# Patient Record
Sex: Female | Born: 1985
Health system: Southern US, Community
[De-identification: ages and names within clinical notes are randomized; demographics above are authoritative.]

## PROBLEM LIST (undated history)

## (undated) DIAGNOSIS — F988 Other specified behavioral and emotional disorders with onset usually occurring in childhood and adolescence: Secondary | ICD-10-CM

## (undated) DIAGNOSIS — F429 Obsessive-compulsive disorder, unspecified: Secondary | ICD-10-CM

## (undated) DIAGNOSIS — F319 Bipolar disorder, unspecified: Secondary | ICD-10-CM

## (undated) DIAGNOSIS — F4310 Post-traumatic stress disorder, unspecified: Secondary | ICD-10-CM

## (undated) DIAGNOSIS — O200 Threatened abortion: Principal | ICD-10-CM

## (undated) DIAGNOSIS — N912 Amenorrhea, unspecified: Secondary | ICD-10-CM

## (undated) DIAGNOSIS — F419 Anxiety disorder, unspecified: Secondary | ICD-10-CM

## (undated) DIAGNOSIS — M722 Plantar fascial fibromatosis: Secondary | ICD-10-CM

## (undated) DIAGNOSIS — R454 Irritability and anger: Secondary | ICD-10-CM

## (undated) HISTORY — DX: Threatened abortion: O20.0

## (undated) HISTORY — PX: APPENDECTOMY: SHX54

## (undated) HISTORY — DX: Obsessive-compulsive disorder, unspecified: F42.9

## (undated) HISTORY — DX: Other specified behavioral and emotional disorders with onset usually occurring in childhood and adolescence: F98.8

## (undated) HISTORY — DX: Bipolar disorder, unspecified: F31.9

## (undated) HISTORY — DX: Irritability and anger: R45.4

## (undated) HISTORY — DX: Amenorrhea, unspecified: N91.2

## (undated) HISTORY — PX: FINGER SURGERY: SHX640

## (undated) HISTORY — DX: Post-traumatic stress disorder, unspecified: F43.10

## (undated) HISTORY — DX: Plantar fascial fibromatosis: M72.2

---

## 2007-06-07 ENCOUNTER — Ambulatory Visit: Payer: Self-pay | Admitting: Cardiology

## 2008-11-05 ENCOUNTER — Other Ambulatory Visit: Admission: RE | Admit: 2008-11-05 | Discharge: 2008-11-05 | Payer: Self-pay | Admitting: Obstetrics & Gynecology

## 2008-12-11 ENCOUNTER — Emergency Department (HOSPITAL_COMMUNITY): Admission: EM | Admit: 2008-12-11 | Discharge: 2008-12-11 | Payer: Self-pay | Admitting: Emergency Medicine

## 2009-04-20 ENCOUNTER — Inpatient Hospital Stay (HOSPITAL_COMMUNITY): Admission: AD | Admit: 2009-04-20 | Discharge: 2009-04-21 | Payer: Self-pay | Admitting: Obstetrics & Gynecology

## 2009-04-20 ENCOUNTER — Ambulatory Visit: Payer: Self-pay | Admitting: Obstetrics & Gynecology

## 2009-07-31 ENCOUNTER — Emergency Department (HOSPITAL_COMMUNITY): Admission: EM | Admit: 2009-07-31 | Discharge: 2009-08-01 | Payer: Self-pay | Admitting: Emergency Medicine

## 2009-08-05 ENCOUNTER — Other Ambulatory Visit: Admission: RE | Admit: 2009-08-05 | Discharge: 2009-08-05 | Payer: Self-pay | Admitting: Obstetrics and Gynecology

## 2009-10-03 ENCOUNTER — Emergency Department (HOSPITAL_BASED_OUTPATIENT_CLINIC_OR_DEPARTMENT_OTHER): Admission: EM | Admit: 2009-10-03 | Discharge: 2009-10-04 | Payer: Self-pay | Admitting: Emergency Medicine

## 2009-10-04 ENCOUNTER — Ambulatory Visit: Payer: Self-pay | Admitting: Radiology

## 2009-11-14 ENCOUNTER — Emergency Department (HOSPITAL_COMMUNITY): Admission: EM | Admit: 2009-11-14 | Discharge: 2009-11-14 | Payer: Self-pay | Admitting: Emergency Medicine

## 2010-10-04 ENCOUNTER — Emergency Department (HOSPITAL_COMMUNITY)
Admission: EM | Admit: 2010-10-04 | Discharge: 2010-10-04 | Payer: Self-pay | Source: Home / Self Care | Admitting: Emergency Medicine

## 2011-01-05 LAB — DIFFERENTIAL
Basophils Relative: 1 % (ref 0–1)
Eosinophils Absolute: 0.3 10*3/uL (ref 0.0–0.7)
Lymphocytes Relative: 34 % (ref 12–46)
Monocytes Relative: 7 % (ref 3–12)
Neutro Abs: 3.9 10*3/uL (ref 1.7–7.7)

## 2011-01-05 LAB — URINALYSIS, ROUTINE W REFLEX MICROSCOPIC
Glucose, UA: NEGATIVE mg/dL
Hgb urine dipstick: NEGATIVE
Specific Gravity, Urine: 1.025 (ref 1.005–1.030)
Urobilinogen, UA: 0.2 mg/dL (ref 0.0–1.0)
pH: 6 (ref 5.0–8.0)

## 2011-01-05 LAB — HEPATIC FUNCTION PANEL
ALT: 37 U/L — ABNORMAL HIGH (ref 0–35)
Albumin: 3.6 g/dL (ref 3.5–5.2)
Alkaline Phosphatase: 90 U/L (ref 39–117)
Bilirubin, Direct: 0.1 mg/dL (ref 0.0–0.3)

## 2011-01-05 LAB — CBC
HCT: 40 % (ref 36.0–46.0)
Hemoglobin: 13.3 g/dL (ref 12.0–15.0)
MCHC: 33.3 g/dL (ref 30.0–36.0)
Platelets: 233 10*3/uL (ref 150–400)
RBC: 4.75 MIL/uL (ref 3.87–5.11)

## 2011-01-05 LAB — BASIC METABOLIC PANEL
GFR calc Af Amer: >60 mL/min (ref >60)
GFR calc non Af Amer: >60 mL/min (ref >60)
Potassium: 3.8 mEq/L (ref 3.5–5.1)

## 2011-01-10 LAB — URINALYSIS, ROUTINE W REFLEX MICROSCOPIC
Hgb urine dipstick: NEGATIVE
Ketones, ur: NEGATIVE mg/dL
Leukocytes, UA: NEGATIVE
Nitrite: NEGATIVE
Protein, ur: 100 mg/dL — AB
Urobilinogen, UA: 0.2 mg/dL (ref 0.0–1.0)
pH: 6 (ref 5.0–8.0)

## 2011-01-10 LAB — COMPREHENSIVE METABOLIC PANEL
ALT: 28 U/L (ref 0–35)
AST: 19 U/L (ref 0–37)
Alkaline Phosphatase: 129 U/L — ABNORMAL HIGH (ref 39–117)
BUN: 8 mg/dL (ref 6–23)
Chloride: 109 mEq/L (ref 96–112)
GFR calc Af Amer: >60 mL/min (ref >60)
GFR calc non Af Amer: >60 mL/min (ref >60)
Sodium: 139 mEq/L (ref 135–145)
Total Bilirubin: 0.6 mg/dL (ref 0.3–1.2)
Total Protein: 8.3 g/dL (ref 6.0–8.3)

## 2011-01-10 LAB — DIFFERENTIAL
Basophils Absolute: 0.1 10*3/uL (ref 0.0–0.1)
Basophils Relative: 1 % (ref 0–1)
Eosinophils Absolute: 0.2 10*3/uL (ref 0.0–0.7)
Eosinophils Relative: 2 % (ref 0–5)
Lymphocytes Relative: 12 % (ref 12–46)

## 2011-01-10 LAB — URINE MICROSCOPIC-ADD ON

## 2011-01-10 LAB — CBC
MCHC: 32.3 g/dL (ref 30.0–36.0)
MCV: 75.2 fL — ABNORMAL LOW (ref 78.0–100.0)
Platelets: 406 10*3/uL — ABNORMAL HIGH (ref 150–400)
RBC: 6.26 MIL/uL — ABNORMAL HIGH (ref 3.87–5.11)
WBC: 15.2 10*3/uL — ABNORMAL HIGH (ref 4.0–10.5)

## 2011-01-10 LAB — LIPASE, BLOOD: Lipase: 23 U/L (ref 11–59)

## 2011-01-10 LAB — PREGNANCY, URINE: Preg Test, Ur: NEGATIVE

## 2011-02-01 LAB — RH IMMUNE GLOB WKUP(>/=20WKS)(NOT WOMEN'S HOSP): Fetal Screen: NEGATIVE

## 2011-02-01 LAB — CBC
HCT: 33.5 % — ABNORMAL LOW (ref 36.0–46.0)
Hemoglobin: 11.5 g/dL — ABNORMAL LOW (ref 12.0–15.0)
MCHC: 34.4 g/dL (ref 30.0–36.0)
Platelets: 285 10*3/uL (ref 150–400)
RBC: 4.07 MIL/uL (ref 3.87–5.11)
RDW: 14.5 % (ref 11.5–15.5)

## 2011-02-09 LAB — DIFFERENTIAL
Basophils Relative: 1 % (ref 0–1)
Eosinophils Absolute: 0.1 10*3/uL (ref 0.0–0.7)
Eosinophils Relative: 1 % (ref 0–5)
Monocytes Absolute: 0.8 10*3/uL (ref 0.1–1.0)
Monocytes Relative: 6 % (ref 3–12)
Neutrophils Relative %: 84 % — ABNORMAL HIGH (ref 43–77)

## 2011-02-09 LAB — COMPREHENSIVE METABOLIC PANEL
ALT: 15 U/L (ref 0–35)
Alkaline Phosphatase: 83 U/L (ref 39–117)
Glucose, Bld: 101 mg/dL — ABNORMAL HIGH (ref 70–99)
Potassium: 3.2 mEq/L — ABNORMAL LOW (ref 3.5–5.1)
Sodium: 139 mEq/L (ref 135–145)
Total Protein: 6.1 g/dL (ref 6.0–8.3)

## 2011-02-09 LAB — URINALYSIS, ROUTINE W REFLEX MICROSCOPIC
Bilirubin Urine: NEGATIVE
Ketones, ur: NEGATIVE mg/dL
Nitrite: NEGATIVE
Protein, ur: NEGATIVE mg/dL
Urobilinogen, UA: 0.2 mg/dL (ref 0.0–1.0)

## 2011-02-09 LAB — URINE CULTURE

## 2011-02-09 LAB — CBC
Hemoglobin: 12.2 g/dL (ref 12.0–15.0)
RBC: 4.22 MIL/uL (ref 3.87–5.11)
RDW: 14.1 % (ref 11.5–15.5)
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
## 2013-03-31 DIAGNOSIS — J45909 Unspecified asthma, uncomplicated: Secondary | ICD-10-CM | POA: Insufficient documentation

## 2013-06-28 NOTE — H&P (Signed)
HISTORY AND PHYSICAL  Heather Khan is a 27 y.o. female patient with CC: painful teeth  No diagnosis found.  No past medical history on file.  No current facility-administered medications for this encounter.   No current outpatient prescriptions on file.   Allergies not on file Active Problems:   * No active hospital problems. *  Vitals: There were no vitals taken for this visit. Lab results:No results found for this or any previous visit (from the past 24 hour(s)). Radiology Results: No results found. General appearance: alert, cooperative and morbidly obese Head: Normocephalic, without obvious abnormality, atraumatic Eyes: negative Ears: normal TM's and external ear canals both ears Nose: Nares normal. Septum midline. Mucosa normal. No drainage or sinus tenderness. Throat: dental caries and bone loss teeth #'s 3, 4, 5, 8, 9, 10, 11, 12, 14, 18, 19, 29, 30 Neck: no adenopathy, supple, symmetrical, trachea midline and thyroid not enlarged, symmetric, no tenderness/mass/nodules Resp: clear to auscultation bilaterally Cardio: regular rate and rhythm, S1, S2 normal, no murmur, click, rub or gallop  Assessment: 27 YO obese with non-restorableteeth #'s 3, 4, 5, 8, 9, 10, 11, 12, 14, 18, 19, 29, 30.   Plan: extraction teeth #'s 3, 4, 5, 8, 9, 10, 11, 12, 14, 18, 19, 29, 30. Alveoloplasty. General anesthesia. Day surgery.   Craig Ionescu M 06/28/2013

## 2013-06-29 NOTE — Progress Notes (Signed)
Called Dr. Randa Evens answering service [phone] to make him aware that several unsuccessful attempts were made to contact pt for pre-op instructions.

## 2013-07-02 ENCOUNTER — Ambulatory Visit (HOSPITAL_COMMUNITY): Payer: Medicaid Other | Admitting: Anesthesiology

## 2013-07-02 ENCOUNTER — Encounter (HOSPITAL_COMMUNITY): Payer: Self-pay | Admitting: Anesthesiology

## 2013-07-02 ENCOUNTER — Encounter (HOSPITAL_COMMUNITY): Payer: Self-pay | Admitting: *Deleted

## 2013-07-02 ENCOUNTER — Encounter (HOSPITAL_COMMUNITY)
Admission: RE | Disposition: A | Discharge status: Home / Self Care | Payer: Self-pay | Source: Ambulatory Visit | Attending: Oral Surgery

## 2013-07-02 ENCOUNTER — Ambulatory Visit (HOSPITAL_COMMUNITY)
Admission: RE | Admit: 2013-07-02 | Discharge: 2013-07-02 | Disposition: A | Discharge status: Home / Self Care | Payer: Medicaid Other | Source: Ambulatory Visit | Attending: Oral Surgery | Admitting: Oral Surgery

## 2013-07-02 DIAGNOSIS — K029 Dental caries, unspecified: Secondary | ICD-10-CM

## 2013-07-02 DIAGNOSIS — F17200 Nicotine dependence, unspecified, uncomplicated: Secondary | ICD-10-CM | POA: Insufficient documentation

## 2013-07-02 DIAGNOSIS — K056 Periodontal disease, unspecified: Secondary | ICD-10-CM

## 2013-07-02 DIAGNOSIS — Z6839 Body mass index (BMI) 39.0-39.9, adult: Secondary | ICD-10-CM | POA: Insufficient documentation

## 2013-07-02 DIAGNOSIS — F411 Generalized anxiety disorder: Secondary | ICD-10-CM | POA: Insufficient documentation

## 2013-07-02 DIAGNOSIS — M899 Disorder of bone, unspecified: Secondary | ICD-10-CM | POA: Insufficient documentation

## 2013-07-02 HISTORY — PX: MULTIPLE EXTRACTIONS WITH ALVEOLOPLASTY: SHX5342

## 2013-07-02 HISTORY — DX: Anxiety disorder, unspecified: F41.9

## 2013-07-02 LAB — COMPREHENSIVE METABOLIC PANEL
AST: 20 U/L (ref 0–37)
Albumin: 3.6 g/dL (ref 3.5–5.2)
Calcium: 9.3 mg/dL (ref 8.4–10.5)
Creatinine, Ser: 0.66 mg/dL (ref 0.50–1.10)
Total Protein: 7.1 g/dL (ref 6.0–8.3)

## 2013-07-02 LAB — CBC
MCH: 28.6 pg (ref 26.0–34.0)
MCHC: 33.2 g/dL (ref 30.0–36.0)
MCV: 86.2 fL (ref 78.0–100.0)
Platelets: 242 10*3/uL (ref 150–400)
RDW: 14.6 % (ref 11.5–15.5)
WBC: 7.8 10*3/uL (ref 4.0–10.5)

## 2013-07-02 SURGERY — MULTIPLE EXTRACTION WITH ALVEOLOPLASTY
Anesthesia: General | Site: Mouth | Wound class: Clean Contaminated

## 2013-07-02 MED ORDER — OXYCODONE HCL 5 MG/5ML PO SOLN: 5.0000 mg | Freq: Once | ORAL | Status: DC | PRN

## 2013-07-02 MED ORDER — MIDAZOLAM HCL 5 MG/5ML IJ SOLN
INTRAMUSCULAR | Status: DC | PRN
  Administered 2013-07-02: 2 mg via INTRAVENOUS

## 2013-07-02 MED ORDER — OXYCODONE-ACETAMINOPHEN 5-325 MG PO TABS: 1.0000 | ORAL_TABLET | ORAL | Status: DC | PRN

## 2013-07-02 MED ORDER — MEPERIDINE HCL 25 MG/ML IJ SOLN: 6.2500 mg | INTRAMUSCULAR | Status: DC | PRN

## 2013-07-02 MED ORDER — ONDANSETRON HCL 4 MG/2ML IJ SOLN
INTRAMUSCULAR | Status: AC
  Filled 2013-07-02: qty 2

## 2013-07-02 MED ORDER — HYDROMORPHONE HCL PF 1 MG/ML IJ SOLN
0.2500 mg | INTRAMUSCULAR | Status: DC | PRN
  Administered 2013-07-02 (×2): 0.5 mg via INTRAVENOUS

## 2013-07-02 MED ORDER — OXYCODONE HCL 5 MG PO TABS: 5.0000 mg | ORAL_TABLET | Freq: Once | ORAL | Status: DC | PRN

## 2013-07-02 MED ORDER — LACTATED RINGERS IV SOLN
INTRAVENOUS | Status: DC | PRN
  Administered 2013-07-02: 09:00:00 via INTRAVENOUS

## 2013-07-02 MED ORDER — NEOSTIGMINE METHYLSULFATE 1 MG/ML IJ SOLN
INTRAMUSCULAR | Status: DC | PRN
  Administered 2013-07-02: 3 mg via INTRAVENOUS

## 2013-07-02 MED ORDER — LACTATED RINGERS IV SOLN
INTRAVENOUS | Status: DC
  Administered 2013-07-02: 07:00:00 via INTRAVENOUS

## 2013-07-02 MED ORDER — SODIUM CHLORIDE 0.9 % IR SOLN
Status: DC | PRN
  Administered 2013-07-02: 1000 mL

## 2013-07-02 MED ORDER — CLINDAMYCIN PHOSPHATE 600 MG/50ML IV SOLN
600.0000 mg | Freq: Once | INTRAVENOUS | Status: AC
  Administered 2013-07-02: 600 mg via INTRAVENOUS
  Filled 2013-07-02: qty 50

## 2013-07-02 MED ORDER — FENTANYL CITRATE 0.05 MG/ML IJ SOLN
INTRAMUSCULAR | Status: DC | PRN
  Administered 2013-07-02: 150 ug via INTRAVENOUS
  Administered 2013-07-02: 50 ug via INTRAVENOUS

## 2013-07-02 MED ORDER — PROPOFOL 10 MG/ML IV BOLUS
INTRAVENOUS | Status: DC | PRN
  Administered 2013-07-02 (×2): 100 mg via INTRAVENOUS

## 2013-07-02 MED ORDER — GLYCOPYRROLATE 0.2 MG/ML IJ SOLN
INTRAMUSCULAR | Status: DC | PRN
  Administered 2013-07-02: .4 mg via INTRAVENOUS

## 2013-07-02 MED ORDER — LIDOCAINE-EPINEPHRINE 2 %-1:100000 IJ SOLN
INTRAMUSCULAR | Status: DC | PRN
  Administered 2013-07-02: 16 mL

## 2013-07-02 MED ORDER — LIDOCAINE HCL (CARDIAC) 20 MG/ML IV SOLN
INTRAVENOUS | Status: DC | PRN
  Administered 2013-07-02: 100 mg via INTRAVENOUS

## 2013-07-02 MED ORDER — OXYMETAZOLINE HCL 0.05 % NA SOLN
NASAL | Status: DC | PRN
  Administered 2013-07-02: 2 via NASAL

## 2013-07-02 MED ORDER — ROCURONIUM BROMIDE 100 MG/10ML IV SOLN
INTRAVENOUS | Status: DC | PRN
  Administered 2013-07-02: 50 mg via INTRAVENOUS

## 2013-07-02 MED ORDER — ONDANSETRON HCL 4 MG/2ML IJ SOLN
INTRAMUSCULAR | Status: DC | PRN
  Administered 2013-07-02: 4 mg via INTRAVENOUS

## 2013-07-02 MED ORDER — HYDROMORPHONE HCL PF 1 MG/ML IJ SOLN
INTRAMUSCULAR | Status: AC
  Filled 2013-07-02: qty 1

## 2013-07-02 MED ORDER — ONDANSETRON HCL 4 MG/2ML IJ SOLN
4.0000 mg | Freq: Once | INTRAMUSCULAR | Status: AC | PRN
  Administered 2013-07-02: 4 mg via INTRAVENOUS

## 2013-07-02 SURGICAL SUPPLY — 27 items
BUR CROSS CUT FISSURE 1.6 (BURR) ×2 IMPLANT
BUR EGG ELITE 4.0 (BURR) ×2 IMPLANT
CANISTER SUCTION 2500CC (MISCELLANEOUS) ×2 IMPLANT
CLOTH BEACON ORANGE TIMEOUT ST (SAFETY) ×2 IMPLANT
COVER SURGICAL LIGHT HANDLE (MISCELLANEOUS) ×2 IMPLANT
CRADLE DONUT ADULT HEAD (MISCELLANEOUS) ×2 IMPLANT
DECANTER SPIKE VIAL GLASS SM (MISCELLANEOUS) ×2 IMPLANT
GAUZE PACKING FOLDED 2  STR (GAUZE/BANDAGES/DRESSINGS) ×1
GAUZE PACKING FOLDED 2 STR (GAUZE/BANDAGES/DRESSINGS) ×1 IMPLANT
GLOVE BIO SURGEON STRL SZ 6.5 (GLOVE) ×2 IMPLANT
GLOVE BIO SURGEON STRL SZ7.5 (GLOVE) ×2 IMPLANT
GLOVE BIOGEL PI IND STRL 7.0 (GLOVE) ×1 IMPLANT
GLOVE BIOGEL PI INDICATOR 7.0 (GLOVE) ×1
GOWN STRL NON-REIN LRG LVL3 (GOWN DISPOSABLE) ×2 IMPLANT
GOWN STRL REIN XL XLG (GOWN DISPOSABLE) ×2 IMPLANT
KIT BASIN OR (CUSTOM PROCEDURE TRAY) ×2 IMPLANT
KIT ROOM TURNOVER OR (KITS) ×2 IMPLANT
NEEDLE 22X1 1/2 (OR ONLY) (NEEDLE) ×2 IMPLANT
NS IRRIG 1000ML POUR BTL (IV SOLUTION) ×2 IMPLANT
PAD ARMBOARD 7.5X6 YLW CONV (MISCELLANEOUS) ×4 IMPLANT
SUT CHROMIC 3 0 PS 2 (SUTURE) ×4 IMPLANT
SYR CONTROL 10ML LL (SYRINGE) ×2 IMPLANT
TOWEL OR 17X26 10 PK STRL BLUE (TOWEL DISPOSABLE) ×2 IMPLANT
TRAY ENT MC OR (CUSTOM PROCEDURE TRAY) ×2 IMPLANT
TUBING IRRIGATION (MISCELLANEOUS) ×2 IMPLANT
WATER STERILE IRR 1000ML POUR (IV SOLUTION) IMPLANT
YANKAUER SUCT BULB TIP NO VENT (SUCTIONS) ×2 IMPLANT

## 2013-07-02 NOTE — Addendum Note (Signed)
Addendum created 07/02/13 1756 by Coralee Rud, CRNA   Modules edited: Anesthesia Medication Administration

## 2013-07-02 NOTE — Transfer of Care (Signed)
Immediate Anesthesia Transfer of Care Note  Patient: Heather Khan  Procedure(s) Performed: Procedure(s): MULTIPLE EXTRACION 3, 4, 5, 8, 9, 10, 11, 12, 14, 18, 19, 29, 30 WITH ALVEOLOPLASTY (N/A)  Patient Location: PACU  Anesthesia Type:General  Level of Consciousness: awake, sedated and patient cooperative  Airway & Oxygen Therapy: Patient Spontanous Breathing  Post-op Assessment: Report given to PACU RN  Post vital signs: Reviewed and stable  Complications: No apparent anesthesia complications

## 2013-07-02 NOTE — Anesthesia Postprocedure Evaluation (Signed)
Anesthesia Post Note  Patient: Heather Khan  Procedure(s) Performed: Procedure(s) (LRB): MULTIPLE EXTRACION 3, 4, 5, 8, 9, 10, 11, 12, 14, 18, 19, 29, 30 WITH ALVEOLOPLASTY (N/A)  Anesthesia type: general  Patient location: PACU  Post pain: Pain level controlled  Post assessment: Patient's Cardiovascular Status Stable  Last Vitals:  Filed Vitals:   07/02/13 1145  BP: 98/46  Pulse: 68  Temp:   Resp: 16    Post vital signs: Reviewed and stable  Level of consciousness: sedated  Complications: No apparent anesthesia complications

## 2013-07-02 NOTE — Op Note (Signed)
07/02/2013  10:00 AM  PATIENT:  Joselyn Arrow  27 y.o. female  PRE-OPERATIVE DIAGNOSIS:  NON RESTORABLE TEETH 3, 4, 5, 8, 9, 10, 11, 12, 14, 18, 19, 29, 30  POST-OPERATIVE DIAGNOSIS:  SAME  PROCEDURE:  Procedure(s): MULTIPLE EXTRACION 3, 4, 5, 8, 9, 10, 11, 12, 14, 18, 19, 29, 30 WITH ALVEOLOPLASTY  SURGEON:  Surgeon(s): Georgia Lopes, DDS  ANESTHESIA:   local and general  EBL:  minimal  DRAINS: none   SPECIMEN:  No Specimen  COUNTS:  YES  PLAN OF CARE: Discharge to home after PACU  PATIENT DISPOSITION:  PACU - hemodynamically stable.   PROCEDURE DETAILS: Dictation #  Georgia Lopes, DMD 07/02/2013 10:00 AM

## 2013-07-02 NOTE — Anesthesia Procedure Notes (Signed)
Procedure Name: Intubation Date/Time: 07/02/2013 9:17 AM Performed by: Coralee Rud Pre-anesthesia Checklist: Patient identified, Emergency Drugs available, Suction available and Patient being monitored Patient Re-evaluated:Patient Re-evaluated prior to inductionOxygen Delivery Method: Circle system utilized Preoxygenation: Pre-oxygenation with 100% oxygen Intubation Type: IV induction Ventilation: Mask ventilation without difficulty Laryngoscope Size: Miller and 3 Nasal Tubes: Right and Nasal Rae Tube size: 7.0 mm Number of attempts: 1 Placement Confirmation: ETT inserted through vocal cords under direct vision and positive ETCO2

## 2013-07-02 NOTE — Preoperative (Signed)
Beta Blockers   Reason not to administer Beta Blockers:Not Applicable 

## 2013-07-02 NOTE — Anesthesia Preprocedure Evaluation (Signed)
Anesthesia Evaluation  Patient identified by MRN, date of birth, ID band Patient awake    Reviewed: Allergy & Precautions, H&P , NPO status , Patient's Chart, lab work & pertinent test results  Airway Mallampati: I TM Distance: >3 FB Neck ROM: Full    Dental   Pulmonary          Cardiovascular     Neuro/Psych Anxiety    GI/Hepatic   Endo/Other    Renal/GU      Musculoskeletal   Abdominal   Peds  Hematology   Anesthesia Other Findings   Reproductive/Obstetrics                           Anesthesia Physical Anesthesia Plan  ASA: II  Anesthesia Plan: General   Post-op Pain Management:    Induction: Intravenous  Airway Management Planned: Nasal ETT  Additional Equipment:   Intra-op Plan:   Post-operative Plan: Extubation in OR  Informed Consent: I have reviewed the patients History and Physical, chart, labs and discussed the procedure including the risks, benefits and alternatives for the proposed anesthesia with the patient or authorized representative who has indicated his/her understanding and acceptance.     Plan Discussed with: CRNA and Surgeon  Anesthesia Plan Comments:         Anesthesia Quick Evaluation

## 2013-07-02 NOTE — H&P (Signed)
H&P documentation  -History and Physical Reviewed  -Patient has been re-examined  -No change in the plan of care  Heather Khan M  

## 2013-07-03 ENCOUNTER — Encounter (HOSPITAL_COMMUNITY): Payer: Self-pay | Admitting: Oral Surgery

## 2013-07-03 NOTE — Op Note (Signed)
NAMENEOLA, WORRALL               ACCOUNT NO.:  [id number]  MEDICAL RECORD NO.:  [id number]  LOCATION:  [facility]                         FACILITY:  [facility]  PHYSICIAN:  Georgia Lopes, M.D.  DATE OF BIRTH:  11-19-1985  DATE OF PROCEDURE:  07/02/2013 DATE OF DISCHARGE:  07/02/2013                              OPERATIVE REPORT   PREOPERATIVE DIAGNOSIS:  Nonrestorable teeth #3, #4, #5, #8, #9, #10, #11, #12, #14, #18, #19, #29, #30.  POSTOPERATIVE DIAGNOSIS:  Nonrestorable teeth #3, #4, #5, #8, #9, #10, #11, #12, #14, #18, #19, #29, #30.  PROCEDURE:  Extraction of teeth #3, #4, #5, #8, #9, #10, #11, #12, #14, #18, #19, #29, #30.  SURGEON:  Georgia Lopes, M.D.  ANESTHESIA:  General nasal intubation, Dr. Michelle Piper attending.  INDICATIONS FOR PROCEDURE:  Greidy is a 27 year old female who is referred to me by her general dentist for removal of all remaining top teeth and removal of posterior teeth in the mandible.  Past medical history significant for morbid obesity and she is a smoker. Because of the extensiveness of the surgery, need for adequate anesthesia, and airway protection, it was recommended that the patient undergo the procedure at [facility] for intubation under general anesthesia.  PROCEDURE:  The patient was taken to the operating room, placed on the table in supine position.  General anesthesia was administered intravenously and a nasal endotracheal tube was placed and secured.  The eyes were protected.  The patient was draped for the procedure.  Time- out was performed.  The posterior pharynx was suctioned, and then 2% lidocaine 1:100,000 epinephrine was infiltrated in the right and left inferior alveolar block in the mandible and then buccal and palatal infiltration in the maxilla, a total of 16 mL of solution was utilized. A bite-block was placed on the right side of the mouth and a Sweetheart retractor was used to retract the tongue and the left side was operated first.  A  15-blade was used to make an incision around teeth #18 and #19 on the buccal and lingual surface in the mandible and around teeth #8, #9, #10, #11, #12, and #14 in the maxilla.  The periosteum was reflected with a periosteal elevator.  The teeth were elevated with a 301 elevator.  Teeth #8, #9, #10, #11, and #12 were removed uneventfully with the universal upper forceps.  Teeth #14, #18, and #19 required bone removal and sectioning.  The teeth were removed with a 301 elevator. The sockets were then curetted and irrigated and then in the maxilla the periosteum was further reflected to expose the alveolar crest and alveoplasty was performed using the egg-shaped bur and bone file.  Then, the areas were irrigated and closed with 3-0 chromic.  The bite-block was repositioned to the other side of the mouth.  A 15-blade was used to make an incision around teeth #3, #4, and #5 in the maxilla on the palatal and buccal aspects and around teeth #29 and #30 in the mandible. The periosteum was reflected with a periosteal elevator in the maxilla. Bone was removed around teeth #3, #4, and #5, and in the mandible around teeth #29 and 30.  The teeth were  elevated.  Tooth #5 was removed with the universal forceps.  Teeth #3, #4, #29, and #30 required additional bone removal and sectioning.  After the teeth were removed, alveoplasty was performed in the right maxilla using the egg-shaped bur and bone file.  Then, the areas were irrigated and closed with 3-0 chromic.  The oral cavity was then inspected and found to have good contour, hemostasis, and closure.  The oral cavity was irrigated, suctioned, throat pack was removed.  The patient was awakened, taken to the recovery room, breathing spontaneously in good condition.  ESTIMATED BLOOD LOSS:  Minimum.  COMPLICATIONS:  None.  SPECIMENS:  None.     Georgia Lopes, M.D.     SMJ/MEDQ  D:  07/02/2013  T:  07/03/2013  Job:  [id number]

## 2014-02-20 ENCOUNTER — Encounter: Payer: Self-pay | Admitting: *Deleted

## 2014-03-26 ENCOUNTER — Telehealth: Payer: Self-pay | Admitting: Family Medicine

## 2014-03-26 NOTE — Telephone Encounter (Signed)
appt given for June 26 with christy

## 2014-04-19 ENCOUNTER — Encounter: Payer: Self-pay | Admitting: Family

## 2014-04-19 ENCOUNTER — Ambulatory Visit (INDEPENDENT_AMBULATORY_CARE_PROVIDER_SITE_OTHER): Payer: Medicaid Other | Admitting: Family

## 2014-04-19 VITALS — BP 100/68 | HR 90 | Temp 98.8°F | Ht 72.0 in | Wt 309.2 lb

## 2014-04-19 DIAGNOSIS — Z7189 Other specified counseling: Secondary | ICD-10-CM

## 2014-04-19 DIAGNOSIS — Z716 Tobacco abuse counseling: Secondary | ICD-10-CM

## 2014-04-19 DIAGNOSIS — F3289 Other specified depressive episodes: Secondary | ICD-10-CM

## 2014-04-19 DIAGNOSIS — F17200 Nicotine dependence, unspecified, uncomplicated: Secondary | ICD-10-CM

## 2014-04-19 DIAGNOSIS — F329 Major depressive disorder, single episode, unspecified: Secondary | ICD-10-CM

## 2014-04-19 DIAGNOSIS — F32A Depression, unspecified: Secondary | ICD-10-CM

## 2014-04-19 MED ORDER — BUPROPION HCL ER (XL) 150 MG PO TB24: ORAL_TABLET | ORAL | Status: DC

## 2014-04-19 NOTE — Patient Instructions (Signed)
Depression, Adult Depression refers to feeling sad, low, down in the dumps, blue, gloomy, or empty. In general, there are two kinds of depression: 1. Depression that we all experience from time to time because of upsetting life experiences, including the loss of a job or the ending of a relationship (normal sadness or normal grief). This kind of depression is considered normal, is short lived, and resolves within a few days to 2 weeks. (Depression experienced after the loss of a loved one is called bereavement. Bereavement often lasts longer than 2 weeks but normally gets better with time.) 2. Clinical depression, which lasts longer than normal sadness or normal grief or interferes with your ability to function at home, at work, and in school. It also interferes with your personal relationships. It affects almost every aspect of your life. Clinical depression is an illness. Symptoms of depression also can be caused by conditions other than normal sadness and grief or clinical depression. Examples of these conditions are listed as follows:  Physical illness--Some physical illnesses, including underactive thyroid gland (hypothyroidism), severe anemia, specific types of cancer, diabetes, uncontrolled seizures, heart and lung problems, strokes, and chronic pain are commonly associated with symptoms of depression.  Side effects of some prescription medicine--In some people, certain types of prescription medicine can cause symptoms of depression.  Substance abuse--Abuse of alcohol and illicit drugs can cause symptoms of depression. SYMPTOMS Symptoms of normal sadness and normal grief include the following:  Feeling sad or crying for short periods of time.  Not caring about anything (apathy).  Difficulty sleeping or sleeping too much.  No longer able to enjoy the things you used to enjoy.  Desire to be by oneself all the time (social isolation).  Lack of energy or motivation.  Difficulty  concentrating or remembering.  Change in appetite or weight.  Restlessness or agitation. Symptoms of clinical depression include the same symptoms of normal sadness or normal grief and also the following symptoms:  Feeling sad or crying all the time.  Feelings of guilt or worthlessness.  Feelings of hopelessness or helplessness.  Thoughts of suicide or the desire to harm yourself (suicidal ideation).  Loss of touch with reality (psychotic symptoms). Seeing or hearing things that are not real (hallucinations) or having false beliefs about your life or the people around you (delusions and paranoia). DIAGNOSIS  The diagnosis of clinical depression usually is based on the severity and duration of the symptoms. Your caregiver also will ask you questions about your medical history and substance use to find out if physical illness, use of prescription medicine, or substance abuse is causing your depression. Your caregiver also may order blood tests. TREATMENT  Typically, normal sadness and normal grief do not require treatment. However, sometimes antidepressant medicine is prescribed for bereavement to ease the depressive symptoms until they resolve. The treatment for clinical depression depends on the severity of your symptoms but typically includes antidepressant medicine, counseling with a mental health professional, or a combination of both. Your caregiver will help to determine what treatment is best for you. Depression caused by physical illness usually goes away with appropriate medical treatment of the illness. If prescription medicine is causing depression, talk with your caregiver about stopping the medicine, decreasing the dose, or substituting another medicine. Depression caused by abuse of alcohol or illicit drugs abuse goes away with abstinence from these substances. Some adults need professional help in order to stop drinking or using drugs. SEEK IMMEDIATE CARE IF:  You have thoughts  about   hurting yourself or others.  You lose touch with reality (have psychotic symptoms).  You are taking medicine for depression and have a serious side effect. FOR MORE INFORMATION National Alliance on Mental Illness: www.nami.[employer] National Institute of Mental Health: http://www.maynard.net/www.nimh.nih.gov Document Released: 10/08/2000 Document Revised: 04/11/2012 Document Reviewed: 01/10/2012 [facility] Patient Information 2015 [place]ExitCare, [place]LL[place]. This information is not intended to replace advice given to you by your health care Heather Khan. Make sure you discuss any questions you have with your health care Heather Khan. Smoking Cessation Quitting smoking is important to your health and has many advantages. However, it is not always easy to quit since nicotine is a very addictive drug. Often times, people try 3 times or more before being able to quit. This document explains the best ways for you to prepare to quit smoking. Quitting takes hard work and a lot of effort, but you can do it. ADVANTAGES OF QUITTING SMOKING  You will live longer, feel better, and live better.  Your body will feel the impact of quitting smoking almost immediately.  Within 20 minutes, blood pressure decreases. Your pulse returns to its normal level.  After 8 hours, carbon monoxide levels in the blood return to normal. Your oxygen level increases.  After 24 hours, the chance of having a heart attack starts to decrease. Your breath, hair, and body stop smelling like smoke.  After 48 hours, damaged nerve endings begin to recover. Your sense of taste and smell improve.  After 72 hours, the body is virtually free of nicotine. Your bronchial tubes relax and breathing becomes easier.  After 2 to 12 weeks, lungs can hold more air. Exercise becomes easier and circulation improves.  The risk of having a heart attack, stroke, cancer, or lung disease is greatly reduced.  After 1 year, the risk of coronary heart disease is cut in half.  After 5  years, the risk of stroke falls to the same as a nonsmoker.  After 10 years, the risk of lung cancer is cut in half and the risk of other cancers decreases significantly.  After 15 years, the risk of coronary heart disease drops, usually to the level of a nonsmoker.  If you are pregnant, quitting smoking will improve your chances of having a healthy baby.  The people you live with, especially any children, will be healthier.  You will have extra money to spend on things other than cigarettes. QUESTIONS TO THINK ABOUT BEFORE ATTEMPTING TO QUIT You may want to talk about your answers with your caregiver.  Why do you want to quit?  If you tried to quit in the past, what helped and what did not?  What will be the most difficult situations for you after you quit? How will you plan to handle them?  Who can help you through the tough times? Your family? Friends? A caregiver?  What pleasures do you get from smoking? What ways can you still get pleasure if you quit? Here are some questions to ask your caregiver:  How can you help me to be successful at quitting?  What medicine do you think would be best for me and how should I take it?  What should I do if I need more help?  What is smoking withdrawal like? How can I get information on withdrawal? GET READY  Set a quit date.  Change your environment by getting rid of all cigarettes, ashtrays, matches, and lighters in your home, car, or work. Do not let people smoke in your home.  Review your past attempts to quit. Think about what worked and what did not. GET SUPPORT AND ENCOURAGEMENT You have a better chance of being successful if you have help. You can get support in many ways.  Tell your family, friends, and co-workers that you are going to quit and need their support. Ask them not to smoke around you.  Get individual, group, or telephone counseling and support. Programs are available at [employer] and health centers. Call  your local health department for information about programs in your area.  Spiritual beliefs and practices may help some smokers quit.  Download a "quit meter" on your computer to keep track of quit statistics, such as how long you have gone without smoking, cigarettes not smoked, and money saved.  Get a self-help book about quitting smoking and staying off of tobacco. LEARN NEW SKILLS AND BEHAVIORS  Distract yourself from urges to smoke. Talk to someone, go for a walk, or occupy your time with a task.  Change your normal routine. Take a different route to work. Drink tea instead of coffee. Eat breakfast in a different place.  Reduce your stress. Take a hot bath, exercise, or read a book.  Plan something enjoyable to do every day. Reward yourself for not smoking.  Explore interactive web-based programs that specialize in helping you quit. GET MEDICINE AND USE IT CORRECTLY Medicines can help you stop smoking and decrease the urge to smoke. Combining medicine with the above behavioral methods and support can greatly increase your chances of successfully quitting smoking.  Nicotine replacement therapy helps deliver nicotine to your body without the negative effects and risks of smoking. Nicotine replacement therapy includes nicotine gum, lozenges, inhalers, nasal sprays, and skin patches. Some may be available over-the-counter and others require a prescription.  Antidepressant medicine helps people abstain from smoking, but how this works is unknown. This medicine is available by prescription.  Nicotinic receptor partial agonist medicine simulates the effect of nicotine in your brain. This medicine is available by prescription. Ask your caregiver for advice about which medicines to use and how to use them based on your health history. Your caregiver will tell you what side effects to look out for if you choose to be on a medicine or therapy. Carefully read the information on the package. Do  not use any other product containing nicotine while using a nicotine replacement product.  RELAPSE OR DIFFICULT SITUATIONS Most relapses occur within the first 3 months after quitting. Do not be discouraged if you start smoking again. Remember, most people try several times before finally quitting. You may have symptoms of withdrawal because your body is used to nicotine. You may crave cigarettes, be irritable, feel very hungry, cough often, get headaches, or have difficulty concentrating. The withdrawal symptoms are only temporary. They are strongest when you first quit, but they will go away within 10-14 days. To reduce the chances of relapse, try to:  Avoid drinking alcohol. Drinking lowers your chances of successfully quitting.  Reduce the amount of caffeine you consume. Once you quit smoking, the amount of caffeine in your body increases and can give you symptoms, such as a rapid heartbeat, sweating, and anxiety.  Avoid smokers because they can make you want to smoke.  Do not let weight gain distract you. Many smokers will gain weight when they quit, usually less than 10 pounds. Eat a healthy diet and stay active. You can always lose the weight gained after you quit.  Find ways to improve  your mood other than smoking. FOR MORE INFORMATION  www.smokefree.gov  Document Released: 10/05/2001 Document Revised: 04/11/2012 Document Reviewed: 01/20/2012 [facility] Patient Information 2015 [place]ExitCare, [place]LLC. This information is not intended to replace advice given to you by your health care Heather Khan. Make sure you discuss any questions you have with your health care Heather Khan.

## 2014-04-19 NOTE — Progress Notes (Signed)
   Subjective:    Patient ID: Heather Khan, female    DOB: 03/13/1986, 28 y.o.   MRN: [id number]  HPI Pt presents to office to establish care and discuss depression. Pt states she has been feeling depression since January. Pt denies trying anything for it, but feels like her being overweight contributes to her depression. Pt would also like to discuss smoking cessation today. She states she smokes about a pack and half a day for the last 5 years.  Pt denies any pain, SOB, or suicidal thoughts.    Review of Systems  HENT: Negative.   Respiratory: Negative.   Cardiovascular: Negative.   Gastrointestinal: Negative.   Genitourinary: Negative.   Musculoskeletal: Negative.   Neurological: Negative.   Hematological: Negative.   All other systems reviewed and are negative.      Objective:   Physical Exam  Vitals reviewed. Constitutional: She is oriented to person, place, and time. She appears well-developed and well-nourished. No distress.  HENT:  Head: Normocephalic and atraumatic.  Right Ear: External ear normal.  Left Ear: External ear normal.  Nose: Nose normal.  Mouth/Throat: Oropharynx is clear and moist.  Eyes: Pupils are equal, round, and reactive to light.  Neck: Normal range of motion. Neck supple. No thyromegaly present.  Cardiovascular: Normal rate, regular rhythm, normal heart sounds and intact distal pulses.   No murmur heard. Pulmonary/Chest: Effort normal and breath sounds normal. No respiratory distress. She has no wheezes.  Abdominal: Soft. Bowel sounds are normal. She exhibits no distension. There is no tenderness.  Musculoskeletal: Normal range of motion. She exhibits no edema and no tenderness.  Neurological: She is alert and oriented to person, place, and time. She has normal reflexes. No cranial nerve deficit.  Skin: Skin is warm and dry.  Psychiatric: She has a normal mood and affect. Her behavior is normal. Judgment and thought content normal.    BP  100/68  Pulse 90  Temp(Src) 98.8 F (37.1 C) (Oral)  Ht 6' (1.829 m)  Wt 309 lb 3.2 oz (140.252 kg)  BMI 41.93 kg/m2       Assessment & Plan:  1. Depression -Stress management -Weight loss discuss -Encouraged healthy eating and exercise - buPROPion (WELLBUTRIN XL) 150 MG 24 hr tablet; 150 mg daily for 3 days, then 150mg  BID for 8 weeks. Start thearpy 1-2 weeks before quite date  Dispense: 60 tablet; Refill: 6  2. Encounter for smoking cessation counseling - buPROPion (WELLBUTRIN XL) 150 MG 24 hr tablet; 150 mg daily for 3 days, then 150mg  BID for 8 weeks. Start thearpy 1-2 weeks before quite date  Dispense: 60 tablet; Refill: 6  Jannifer Rodneyhristy Hawks, FNP

## 2014-05-17 ENCOUNTER — Ambulatory Visit: Payer: Medicaid Other | Admitting: Family

## 2014-05-22 ENCOUNTER — Telehealth: Payer: Self-pay | Admitting: Family

## 2014-05-22 NOTE — Telephone Encounter (Signed)
Appt made

## 2014-05-23 ENCOUNTER — Ambulatory Visit (INDEPENDENT_AMBULATORY_CARE_PROVIDER_SITE_OTHER): Payer: Medicaid Other | Admitting: Family Medicine

## 2014-05-23 ENCOUNTER — Encounter: Payer: Self-pay | Admitting: Family Medicine

## 2014-05-23 VITALS — BP 112/73 | HR 75 | Temp 98.4°F | Ht 72.0 in | Wt 309.4 lb

## 2014-05-23 DIAGNOSIS — M722 Plantar fascial fibromatosis: Secondary | ICD-10-CM

## 2014-05-23 MED ORDER — NAPROXEN 500 MG PO TABS: 500.0000 mg | ORAL_TABLET | Freq: Two times a day (BID) | ORAL | Status: DC

## 2014-05-23 NOTE — Progress Notes (Signed)
   Subjective:    Patient ID: Heather Khan, female    DOB: Jul 12, 1986, 28 y.o.   MRN: [id number]  HPI C/o bilateral foot pain and plantar fascitis.  She has been having worsening pain.  She was seeing ortho and she had heel injections which she states made it worse.  She could not afford the Inserts and she stopped going.  She has hx of obesity and she states she has been dieting and cannot lose weight.  She would like to lose weight and see if this will help her foot problems.   Review of Systems    No chest pain, SOB, HA, dizziness, vision change, N/V, diarrhea, constipation, dysuria, urinary urgency or frequency, myalgias, arthralgias or rash.  Objective:   Physical Exam  Vital signs noted  Well developed well nourished obese female.  HEENT - Head atraumatic Normocephalic                Eyes - PERRLA, Conjuctiva - clear Sclera- Clear EOMI                Ears - EAC's Wnl TM's Wnl Gross Hearing WNL                Nose - Nares patent                 Throat - oropharanx wnl Respiratory - Lungs CTA bilateral Cardiac - RRR S1 and S2 without murmur GI - Abdomen soft Nontender and bowel sounds active x 4 Extremities - No edema. Neuro - Grossly intact. MS - TTP bilateral heels     Assessment & Plan:  Plantar fasciitis, bilateral - Plan: naproxen (NAPROSYN) 500 MG tablet Po bid x 2 weeks and continue rolling over frozen water bottles and stretches and splints. Use otc inserts for feet.  Losing weight will help.  Recommend diet and exercise counseling for  Obesity and follow up prn.  Deatra CanterWilliam J Oxford FNP

## 2014-06-10 ENCOUNTER — Ambulatory Visit (INDEPENDENT_AMBULATORY_CARE_PROVIDER_SITE_OTHER): Payer: Medicaid Other | Admitting: Pharmacist

## 2014-06-10 ENCOUNTER — Encounter: Payer: Self-pay | Admitting: Pharmacist

## 2014-06-10 DIAGNOSIS — R635 Abnormal weight gain: Secondary | ICD-10-CM

## 2014-06-10 DIAGNOSIS — N926 Irregular menstruation, unspecified: Secondary | ICD-10-CM | POA: Insufficient documentation

## 2014-06-10 MED ORDER — PHENTERMINE HCL 37.5 MG PO TABS
ORAL_TABLET | ORAL | Status: DC
Start: 2014-06-10 — End: 2014-07-15

## 2014-06-10 MED ORDER — PHENTERMINE HCL 37.5 MG PO TABS: ORAL_TABLET | ORAL | Status: DC

## 2014-06-10 NOTE — Progress Notes (Signed)
Subjective:     Heather Khan is a 28 y.o. female here for discussion regarding weight loss. She has noted a weight gain of approximately 40 pounds over the last 10 years. She feels ideal weight is 180 pounds. Weight at graduation from high school was 275 pounds. History of eating disorders: none. There is a family history positive for obesity in the patient and father has diebetes. Previous treatments for obesity include OTC appetite suppressants: green tea extract, self-directed dieting and self directed exercise. Obesity associated medical conditions: amenorrhea and difficulty conceiving. Obesity associated medications: none. Cardiovascular risk factors besides obesity: obesity (BMI >= 30 kg/m2), sedentary lifestyle and smoking/ tobacco exposure.  The following portions of the patient's history were reviewed and updated as appropriate: allergies, current medications, past family history, past medical history, past social history, past surgical history and problem list.  Objective:    Body mass index is 41.76 kg/(m^2).  Filed Vitals:   06/10/14 1425  BP: 102/74  Pulse: 80    Assessment:    Obesity. I assessed Heather Khan to be in an action stage with respect to weight loss.    Plan:    General weight loss/lifestyle modification strategies discussed (elicit support from others; identify saboteurs; non-food rewards, etc). Behavioral treatment: stress management. Diet interventions: moderate (500 kCal/d) deficit diet. Regular aerobic exercise program discussed. Medication: phentermine. 37.5mg  1/2 to 1 tablet qam Follow up in 4 weeks.  Henrene Pastorammy Telesia Ates, PharmD, CPP

## 2014-07-08 ENCOUNTER — Ambulatory Visit: Payer: Self-pay

## 2014-07-15 ENCOUNTER — Encounter: Payer: Self-pay | Admitting: Pharmacist

## 2014-07-15 ENCOUNTER — Ambulatory Visit (INDEPENDENT_AMBULATORY_CARE_PROVIDER_SITE_OTHER): Payer: Medicaid Other | Admitting: Pharmacist

## 2014-07-15 ENCOUNTER — Ambulatory Visit: Payer: Medicaid Other | Admitting: Family Medicine

## 2014-07-15 DIAGNOSIS — R635 Abnormal weight gain: Secondary | ICD-10-CM

## 2014-07-15 MED ORDER — PHENTERMINE HCL 37.5 MG PO TABS: ORAL_TABLET | ORAL | Status: DC

## 2014-07-15 NOTE — Addendum Note (Signed)
Addended by: Henrene Pastor on: 07/15/2014 03:12 PM   Modules accepted: Level of Service

## 2014-07-15 NOTE — Progress Notes (Signed)
Subjective:     Heather Khan is a 28 y.o. female here for discussion regarding weight loss. She has noted a weight gain of approximately 40 pounds over the last 10 years. She feels ideal weight is 180 pounds. Weight at graduation from high school was 275 pounds. History of eating disorders: none. There is a family history positive for obesity in the patient and father has diabetes. Previous treatments for obesity include OTC appetite suppressants: green tea extract, self-directed dieting and self directed exercise. Obesity associated medical conditions: amenorrhea and difficulty conceiving. Obesity associated medications: none. Cardiovascular risk factors besides obesity: obesity (BMI >= 30 kg/m2), sedentary lifestyle and smoking/ tobacco exposure.  Started phenterine 37.5mg  1/2 to 1 tablet qam 1 month ago.  Patient has changed diet - no fried foods, limiting sweets, no sodas / water only.  Increase in fresh vegetables.   Patient also has nasal congestion, coughing and facial pain / tenderness for last 3 days.   The following portions of the patient's history were reviewed and updated as appropriate: allergies, current medications, past family history, past medical history, past social history, past surgical history and problem list.  Objective:    Body mass index is 40.81 kg/(m^2).  Filed Vitals:   07/15/14 1218  BP: 108/78  Pulse: 72    Assessment:    Obesity - patient has lost 7# since last month when phentermine started.  Medication Management - patient newly prescribed fluoxitine and trazadone from psychiatrist Suspect sinus infection -    Plan:    General weight loss/lifestyle modification strategies discussed (elicit support from others; identify saboteurs; non-food rewards, etc). Behavioral treatment: stress management. Diet interventions: Continue moderate (500 kCal/d) deficit diet. Regular aerobic exercise program discussed. Medication: phentermine. 37.5mg  1/2 to 1  tablet qam.  I reviewed with patient s/s of serotonin syndrome since on more than 1 medication that can increase serotonin.  Also discussed increase in fertility with weight loss and that phentermine is contraindicated with pregnancy.  Patient to discontinue phentermine is suspected pregnancy.  Follow up in 4 weeks.  ** patient triaged for suspected sinus infection - she is to RTC at 2:45pm for appt to assess.   Henrene Pastor, PharmD, CPP

## 2014-07-16 ENCOUNTER — Ambulatory Visit: Payer: Medicaid Other | Admitting: Family Medicine

## 2014-08-19 ENCOUNTER — Ambulatory Visit: Payer: Self-pay

## 2014-09-16 ENCOUNTER — Telehealth: Payer: Self-pay | Admitting: Family Medicine

## 2014-09-16 NOTE — Telephone Encounter (Signed)
Appointment given for tomorrow with Heather Khan. Patient advised if she got worse to go to urgent care today.

## 2014-09-17 ENCOUNTER — Telehealth: Payer: Self-pay | Admitting: Nurse Practitioner

## 2014-09-17 ENCOUNTER — Ambulatory Visit (INDEPENDENT_AMBULATORY_CARE_PROVIDER_SITE_OTHER): Payer: Medicaid Other | Admitting: Nurse Practitioner

## 2014-09-17 ENCOUNTER — Encounter: Payer: Self-pay | Admitting: Nurse Practitioner

## 2014-09-17 VITALS — BP 110/73 | HR 89 | Temp 98.0°F | Ht 72.0 in | Wt 294.0 lb

## 2014-09-17 DIAGNOSIS — J069 Acute upper respiratory infection, unspecified: Secondary | ICD-10-CM

## 2014-09-17 MED ORDER — DOXYCYCLINE HYCLATE 100 MG PO TABS: 100.0000 mg | ORAL_TABLET | Freq: Two times a day (BID) | ORAL | Status: DC

## 2014-09-17 NOTE — Progress Notes (Signed)
   Subjective:    Patient ID: Heather Khan, female    DOB: Mar 10, 1986, 28 y.o.   MRN: [id number]  HPI Patient in today with c/o cough and congestion that started-1 week ago- no OTC meds    Review of Systems  Constitutional: Negative.  Negative for fever, chills and appetite change.  HENT: Positive for congestion, postnasal drip, sinus pressure and sore throat. Negative for trouble swallowing and voice change.   Respiratory: Positive for cough.   Cardiovascular: Negative.   Genitourinary: Negative.   Neurological: Negative.   Psychiatric/Behavioral: Negative.   All other systems reviewed and are negative.      Objective:   Physical Exam  Constitutional: She is oriented to person, place, and time. She appears well-developed and well-nourished.  HENT:  Right Ear: Hearing, tympanic membrane, external ear and ear canal normal.  Left Ear: Hearing, tympanic membrane, external ear and ear canal normal.  Nose: Mucosal edema and rhinorrhea present. Right sinus exhibits no maxillary sinus tenderness and no frontal sinus tenderness. Left sinus exhibits no maxillary sinus tenderness and no frontal sinus tenderness.  Mouth/Throat: Uvula is midline, oropharynx is clear and moist and mucous membranes are normal.  Neck: Normal range of motion. Neck supple.  Cardiovascular: Normal rate, regular rhythm and normal heart sounds.   Pulmonary/Chest: Effort normal and breath sounds normal.  Deep wet cough  Abdominal: Soft. Bowel sounds are normal.  Lymphadenopathy:    She has no cervical adenopathy.  Neurological: She is alert and oriented to person, place, and time.  Skin: Skin is warm and dry.  Psychiatric: She has a normal mood and affect. Her behavior is normal. Judgment and thought content normal.   BP 110/73 mmHg  Pulse 89  Temp(Src) 98 F (36.7 C) (Oral)  Ht 6' (1.829 m)  Wt 294 lb (133.358 kg)  BMI 39.86 kg/m2        Assessment & Plan:   1. Upper respiratory infection with  cough and congestion    Meds ordered this encounter  Medications  . doxycycline (VIBRA-TABS) 100 MG tablet    Sig: Take 1 tablet (100 mg total) by mouth 2 (two) times daily.    Dispense:  20 tablet    Refill:  0    Order Specific Question:  Supervising Provider    Answer:  Ernestina PennaMOORE, DONALD W [[id number]]   1. Take meds as prescribed 2. Use a cool mist humidifier especially during the winter months and when heat has been humid. 3. Use saline nose sprays frequently 4. Saline irrigations of the nose can be very helpful if done frequently.  * 4X daily for 1 week*  * Use of a nettie pot can be helpful with this. Follow directions with this* 5. Drink plenty of fluids 6. Keep thermostat turn down low 7.For any cough or congestion  Use plain Mucinex- regular strength or max strength is fine   * Children- consult with Pharmacist for dosing 8. For fever or aces or pains- take tylenol or ibuprofen appropriate for age and weight.  * for fevers greater than 101 orally you may alternate ibuprofen and tylenol every  3 hours.   Mary-Margaret Daphine DeutscherMartin, FNP

## 2014-09-17 NOTE — Patient Instructions (Signed)

## 2014-09-18 NOTE — Telephone Encounter (Signed)
Ok to give her a note? Please advise.

## 2014-09-20 NOTE — Telephone Encounter (Signed)
Ok for note 

## 2014-09-23 ENCOUNTER — Encounter: Payer: Self-pay | Admitting: *Deleted

## 2014-09-23 NOTE — Telephone Encounter (Signed)
Left message on mother's phone that work excuse for Marchelle Folksmanda is ready.

## 2014-10-10 ENCOUNTER — Ambulatory Visit: Payer: Medicaid Other | Admitting: Nurse Practitioner

## 2014-10-23 ENCOUNTER — Ambulatory Visit: Payer: Medicaid Other | Admitting: Family Medicine

## 2014-11-01 ENCOUNTER — Encounter (INDEPENDENT_AMBULATORY_CARE_PROVIDER_SITE_OTHER): Payer: Medicaid Other | Admitting: Family Medicine

## 2014-11-01 ENCOUNTER — Encounter (INDEPENDENT_AMBULATORY_CARE_PROVIDER_SITE_OTHER): Payer: Self-pay

## 2014-11-01 DIAGNOSIS — F4312 Post-traumatic stress disorder, chronic: Secondary | ICD-10-CM

## 2014-11-01 DIAGNOSIS — F3181 Bipolar II disorder: Secondary | ICD-10-CM

## 2014-11-01 NOTE — Progress Notes (Signed)
This encounter was created in error - please disregard.

## 2014-11-02 LAB — CMP14+EGFR
ALT: 27 IU/L (ref 0–32)
AST: 12 IU/L (ref 0–40)
Albumin/Globulin Ratio: 1.6 (ref 1.1–2.5)
Albumin: 4.3 g/dL (ref 3.5–5.5)
Alkaline Phosphatase: 102 IU/L (ref 39–117)
BUN/Creatinine Ratio: 14 (ref 8–20)
BUN: 10 mg/dL (ref 6–20)
CO2: 23 mmol/L (ref 18–29)
Calcium: 9.6 mg/dL (ref 8.7–10.2)
Chloride: 99 mmol/L (ref 97–108)
Creatinine, Ser: 0.7 mg/dL (ref 0.57–1.00)
GFR calc Af Amer: 138 mL/min/{1.73_m2} (ref >59)
GFR calc non Af Amer: 120 mL/min/{1.73_m2} (ref >59)
Globulin, Total: 2.7 g/dL (ref 1.5–4.5)
Glucose: 94 mg/dL (ref 65–99)
Potassium: 4.3 mmol/L (ref 3.5–5.2)
Sodium: 141 mmol/L (ref 134–144)
Total Bilirubin: 0.3 mg/dL (ref 0.0–1.2)
Total Protein: 7 g/dL (ref 6.0–8.5)

## 2014-11-02 LAB — CBC WITH DIFFERENTIAL
Basophils Absolute: 0 10*3/uL (ref 0.0–0.2)
Basos: 0 %
Eos: 2 %
Eosinophils Absolute: 0.2 10*3/uL (ref 0.0–0.4)
HCT: 41 % (ref 34.0–46.6)
Hemoglobin: 13.6 g/dL (ref 11.1–15.9)
Immature Grans (Abs): 0 10*3/uL (ref 0.0–0.1)
Immature Granulocytes: 0 %
Lymphocytes Absolute: 2.8 10*3/uL (ref 0.7–3.1)
Lymphs: 29 %
MCH: 28.3 pg (ref 26.6–33.0)
MCHC: 33.2 g/dL (ref 31.5–35.7)
MCV: 85 fL (ref 79–97)
Monocytes Absolute: 0.6 10*3/uL (ref 0.1–0.9)
Monocytes: 6 %
Neutrophils Absolute: 6.1 10*3/uL (ref 1.4–7.0)
Neutrophils Relative %: 63 %
Platelets: 313 10*3/uL (ref 150–379)
RBC: 4.81 x10E6/uL (ref 3.77–5.28)
RDW: 14.3 % (ref 12.3–15.4)
WBC: 9.7 10*3/uL (ref 3.4–10.8)

## 2014-11-02 LAB — TSH: TSH: 1.83 u[IU]/mL (ref 0.450–4.500)

## 2014-11-02 LAB — PREGNANCY, URINE: Preg Test, Ur: NEGATIVE

## 2014-11-02 LAB — LIPID PANEL
Chol/HDL Ratio: 4.2 ratio units (ref 0.0–4.4)
Cholesterol, Total: 135 mg/dL (ref 100–199)
HDL: 32 mg/dL — ABNORMAL LOW (ref >39)
LDL Calculated: 75 mg/dL (ref 0–99)
Triglycerides: 142 mg/dL (ref 0–149)
VLDL Cholesterol Cal: 28 mg/dL (ref 5–40)

## 2015-01-14 ENCOUNTER — Encounter: Payer: Self-pay | Admitting: Family Medicine

## 2015-01-14 ENCOUNTER — Ambulatory Visit (INDEPENDENT_AMBULATORY_CARE_PROVIDER_SITE_OTHER): Payer: Medicaid Other | Admitting: Family Medicine

## 2015-01-14 ENCOUNTER — Other Ambulatory Visit: Payer: Self-pay | Admitting: *Deleted

## 2015-01-14 VITALS — BP 118/82 | HR 103 | Temp 97.2°F | Ht 72.0 in | Wt 303.8 lb

## 2015-01-14 DIAGNOSIS — M722 Plantar fascial fibromatosis: Secondary | ICD-10-CM

## 2015-01-14 DIAGNOSIS — Z3490 Encounter for supervision of normal pregnancy, unspecified, unspecified trimester: Secondary | ICD-10-CM

## 2015-01-14 DIAGNOSIS — N912 Amenorrhea, unspecified: Secondary | ICD-10-CM | POA: Diagnosis not present

## 2015-01-14 LAB — POCT URINE PREGNANCY: Preg Test, Ur: POSITIVE

## 2015-01-14 NOTE — Progress Notes (Signed)
Subjective:  Patient ID: Heather Khan, female    DOB: 05-25-1986  Age: 29 y.o. MRN: [id number]  CC: Plantar Fasciitis New to me.  HPI Heather Khan presents for pain in the feet bilaterally. Left greater than right. She had injections at a podiatrist that made it so much work she couldn't walk for 3 days. Onset 2-3 years ago waxing and waning much increased over the last few weeks. Pain is moderately severe.  History Heather Khan has a past medical history of Anxiety.   She has past surgical history that includes Appendectomy; Finger surgery; and Multiple extractions with alveoloplasty (N/A, 07/02/2013).   Her family history includes Diabetes in her father and maternal grandfather; Healthy in her sister, sister, and son; Hypertension in her mother; Kidney disease in her maternal grandfather.She reports that she has been smoking Cigarettes.  She has a 10 pack-year smoking history. She has never used smokeless tobacco. She reports that she drinks alcohol. She reports that she does not use illicit drugs.  No current outpatient prescriptions on file prior to visit.   No current facility-administered medications on file prior to visit.    ROS Review of Systems  Constitutional: Negative for fever, chills, diaphoresis, appetite change, fatigue and unexpected weight change.  HENT: Negative for congestion, ear pain, hearing loss, postnasal drip, rhinorrhea, sneezing, sore throat and trouble swallowing.   Eyes: Negative for pain.  Respiratory: Negative for cough, chest tightness and shortness of breath.   Cardiovascular: Negative for chest pain and palpitations.  Gastrointestinal: Negative for nausea, vomiting, abdominal pain, diarrhea and constipation.  Genitourinary: Negative for dysuria, frequency and menstrual problem.  Musculoskeletal: Negative for joint swelling and arthralgias.  Skin: Negative for rash.  Neurological: Negative for dizziness, weakness, numbness and headaches.    Psychiatric/Behavioral: Negative for dysphoric mood and agitation.    Objective:  BP 118/82 mmHg  Pulse 103  Temp(Src) 97.2 F (36.2 C) (Oral)  Ht 6' (1.829 m)  Wt 303 lb 12.8 oz (137.803 kg)  BMI 41.19 kg/m2  LMP 11/10/2014  BP Readings from Last 3 Encounters:  01/14/15 118/82  09/17/14 110/73  07/15/14 108/78    Wt Readings from Last 3 Encounters:  01/14/15 303 lb 12.8 oz (137.803 kg)  09/17/14 294 lb (133.358 kg)  07/15/14 301 lb (136.533 kg)     Physical Exam  Constitutional: She is oriented to person, place, and time. She appears well-developed and well-nourished. No distress.  HENT:  Head: Normocephalic and atraumatic.  Right Ear: External ear normal.  Left Ear: External ear normal.  Nose: Nose normal.  Mouth/Throat: Oropharynx is clear and moist.  Eyes: Conjunctivae and EOM are normal. Pupils are equal, round, and reactive to light.  Neck: Normal range of motion. Neck supple. No thyromegaly present.  Cardiovascular: Normal rate, regular rhythm and normal heart sounds.   No murmur heard. Pulmonary/Chest: Effort normal and breath sounds normal. No respiratory distress. She has no wheezes. She has no rales.  Abdominal: Soft. Bowel sounds are normal.  Musculoskeletal: She exhibits tenderness (at the plantar fascial at the plantar heel left greater than right.).  Lymphadenopathy:    She has no cervical adenopathy.  Neurological: She is alert and oriented to person, place, and time. She has normal reflexes.  Skin: Skin is warm and dry.  Psychiatric: She has a normal mood and affect. Her behavior is normal. Judgment and thought content normal.    No results found for: HGBA1C  Lab Results  Component Value Date   WBC  9.7 11/01/2014   HGB 13.6 11/01/2014   HCT 41.0 11/01/2014   PLT 313 11/01/2014   GLUCOSE 94 11/01/2014   CHOL 135 11/01/2014   TRIG 142 11/01/2014   HDL 32* 11/01/2014   LDLCALC 75 11/01/2014   ALT 27 11/01/2014   AST 12 11/01/2014   NA 141  11/01/2014   K 4.3 11/01/2014   CL 99 11/01/2014   CREATININE 0.70 11/01/2014   BUN 10 11/01/2014   CO2 23 11/01/2014   TSH 1.830 11/01/2014    No results found.  Assessment & Plan:   Heather Khan was seen today for plantar fasciitis.  Diagnoses and all orders for this visit:  Plantar fasciitis, bilateral  Amenorrhea Orders: -     POCT urine pregnancy   I have discontinued [name]'s naproxen and doxycycline.  No orders of the defined types were placed in this encounter.   Posterior splinting for plantar fascia Arch support reviewed  Patient information regarding carbohydrate and calorie counting handout given. Follow-up: No Follow-up on file.  Mechele Claude, M.D.

## 2015-01-14 NOTE — Patient Instructions (Addendum)
Wear shoes with excellent arch support. Consider adding an extra arch support on top of that such as a Dr. Margart SicklesScholl's variety. Do not wear flip-flops except for short periods in the shower or something similar.  You will need to start wearing a posterior splint to hold her foot in proper position to heal the plantar fasciitis. You should wear these from the time you first lay down at night until following morning then they can be removed as long she wear good arch support from that time forward.  Weight loss is critical to management of any type of foot pain but especially plantar fascitis. Because of that I believe you could go back on the Adipex and follow a very strict low-carb diet to lose as much weight as possible. Every pound is beneficial to healing plantar fasciitis. However, medications are not generally safe for pregnancy. therefore until you are sure that you're using adequate birth control that you should not take this medicine.  Basic Carbohydrate Counting  Carbohydrate counting is a method for keeping track of the amount of carbohydrates you eat. Eating carbohydrates naturally increases the level of sugar (glucose) in your blood, so it is important for you to know the amount that is okay for you to have in every meal. Carbohydrate counting helps keep the level of glucose in your blood within normal limits. The amount of carbohydrates allowed is different for every person. A dietitian can help you calculate the amount that is right for you. Once you know the amount of carbohydrates you can have, you can count the carbohydrates in the foods you want to eat. Carbohydrates are found in the following foods:  Grains, such as breads and cereals.  Dried beans and soy products.  Starchy vegetables, such as potatoes, peas, and corn.  Fruit and fruit juices.  Milk and yogurt.  Sweets and snack foods, such as cake, cookies, candy, chips, soft drinks, and fruit drinks. CARBOHYDRATE  COUNTING There are two ways to count the carbohydrates in your food. You can use either of the methods or a combination of both. Reading the "Nutrition Facts" on Packaged Food The "Nutrition Facts" is an area that is included on the labels of almost all packaged food and beverages in the [place]. It includes the serving size of that food or beverage and information about the nutrients in each serving of the food, including the grams (g) of carbohydrate per serving.  Decide the number of servings of this food or beverage that you will be able to eat or drink. Multiply that number of servings by the number of grams of carbohydrate that is listed on the label for that serving. The total will be the amount of carbohydrates you will be having when you eat or drink this food or beverage. Learning Standard Serving Sizes of Food When you eat food that is not packaged or does not include "Nutrition Facts" on the label, you need to measure the servings in order to count the amount of carbohydrates.A serving of most carbohydrate-rich foods contains about 15 g of carbohydrates. The following list includes serving sizes of carbohydrate-rich foods that provide 15 g ofcarbohydrate per serving:   1 slice of bread (1 oz) or 1 six-inch tortilla.    of a hamburger bun or English muffin.  4-6 crackers.   cup unsweetened dry cereal.    cup hot cereal.   cup rice or pasta.    cup mashed potatoes or  of a large baked potato.  1 cup fresh fruit or one small piece of fruit.    cup canned or frozen fruit or fruit juice.  1 cup milk.   cup plain fat-free yogurt or yogurt sweetened with artificial sweeteners.   cup cooked dried beans or starchy vegetable, such as peas, corn, or potatoes.  Decide the number of standard-size servings that you will eat. Multiply that number of servings by 15 (the grams of carbohydrates in that serving). For example, if you eat 2 cups of strawberries, you will  have eaten 2 servings and 30 g of carbohydrates (2 servings x 15 g = 30 g). For foods such as soups and casseroles, in which more than one food is mixed in, you will need to count the carbohydrates in each food that is included. EXAMPLE OF CARBOHYDRATE COUNTING Sample Dinner  3 oz chicken breast.   cup of brown rice.   cup of corn.  1 cup milk.   1 cup strawberries with sugar-free whipped topping.  Carbohydrate Calculation Step 1: Identify the foods that contain carbohydrates:   Rice.   Corn.   Milk.   Strawberries. Step 2:Calculate the number of servings eaten of each:   2 servings of rice.   1 serving of corn.   1 serving of milk.   1 serving of strawberries. Step 3: Multiply each of those number of servings by 15 g:   2 servings of rice x 15 g = 30 g.   1 serving of corn x 15 g = 15 g.   1 serving of milk x 15 g = 15 g.   1 serving of strawberries x 15 g = 15 g. Step 4: Add together all of the amounts to find the total grams of carbohydrates eaten: 30 g + 15 g + 15 g + 15 g = 75 g. Document Released: 10/11/2005 Document Revised: 02/25/2014 Document Reviewed: 09/07/2013 [facility] Patient Information 2015 [place], [place]. This information is not intended to replace advice given to you by your health care provider. Make sure you discuss any questions you have with your health care provider. Calorie Counting for Weight Loss Calories are energy you get from the things you eat and drink. Your body uses this energy to keep you going throughout the day. The number of calories you eat affects your weight. When you eat more calories than your body needs, your body stores the extra calories as fat. When you eat fewer calories than your body needs, your body burns fat to get the energy it needs. Calorie counting means keeping track of how many calories you eat and drink each day. If you make sure to eat fewer calories than your body needs, you should lose weight.  In order for calorie counting to work, you will need to eat the number of calories that are right for you in a day to lose a healthy amount of weight per week. A healthy amount of weight to lose per week is usually 1-2 lb (0.5-0.9 kg). A dietitian can determine how many calories you need in a day and give you suggestions on how to reach your calorie goal.  WHAT IS MY MY PLAN? My goal is to have __________ calories per day.  If I have this many calories per day, I should lose around __________ pounds per week. WHAT DO I NEED TO KNOW ABOUT CALORIE COUNTING? In order to meet your daily calorie goal, you will need to:  Find out how many calories are in each food  you would like to eat. Try to do this before you eat.  Decide how much of the food you can eat.  Write down what you ate and how many calories it had. Doing this is called keeping a food log. WHERE DO I FIND CALORIE INFORMATION? The number of calories in a food can be found on a Nutrition Facts label. Note that all the information on a label is based on a specific serving of the food. If a food does not have a Nutrition Facts label, try to look up the calories online or ask your dietitian for help. HOW DO I DECIDE HOW MUCH TO EAT? To decide how much of the food you can eat, you will need to consider both the number of calories in one serving and the size of one serving. This information can be found on the Nutrition Facts label. If a food does not have a Nutrition Facts label, look up the information online or ask your dietitian for help. Remember that calories are listed per serving. If you choose to have more than one serving of a food, you will have to multiply the calories per serving by the amount of servings you plan to eat. For example, the label on a package of bread might say that a serving size is 1 slice and that there are 90 calories in a serving. If you eat 1 slice, you will have eaten 90 calories. If you eat 2 slices, you will have  eaten 180 calories. HOW DO I KEEP A FOOD LOG? After each meal, record the following information in your food log:  What you ate.  How much of it you ate.  How many calories it had.  Then, add up your calories. Keep your food log near you, such as in a small notebook in your pocket. Another option is to use a mobile app or website. Some programs will calculate calories for you and show you how many calories you have left each time you add an item to the log. WHAT ARE SOME CALORIE COUNTING TIPS?  Use your calories on foods and drinks that will fill you up and not leave you hungry. Some examples of this include foods like nuts and nut butters, vegetables, lean proteins, and high-fiber foods (more than 5 g fiber per serving).  Eat nutritious foods and avoid empty calories. Empty calories are calories you get from foods or beverages that do not have many nutrients, such as candy and soda. It is better to have a nutritious high-calorie food (such as an avocado) than a food with few nutrients (such as a bag of chips).  Know how many calories are in the foods you eat most often. This way, you do not have to look up how many calories they have each time you eat them.  Look out for foods that may seem like low-calorie foods but are really high-calorie foods, such as baked goods, soda, and fat-free candy.  Pay attention to calories in drinks. Drinks such as sodas, specialty coffee drinks, alcohol, and juices have a lot of calories yet do not fill you up. Choose low-calorie drinks like water and diet drinks.  Focus your calorie counting efforts on higher calorie items. Logging the calories in a garden salad that contains only vegetables is less important than calculating the calories in a milk shake.  Find a way of tracking calories that works for you. Get creative. Most people who are successful find ways to keep track of how  much they eat in a day, even if they do not count every calorie. WHAT ARE  SOME PORTION CONTROL TIPS?  Know how many calories are in a serving. This will help you know how many servings of a certain food you can have.  Use a measuring cup to measure serving sizes. This is helpful when you start out. With time, you will be able to estimate serving sizes for some foods.  Take some time to put servings of different foods on your favorite plates, bowls, and cups so you know what a serving looks like.  Try not to eat straight from a bag or box. Doing this can lead to overeating. Put the amount you would like to eat in a cup or on a plate to make sure you are eating the right portion.  Use smaller plates, glasses, and bowls to prevent overeating. This is a quick and easy way to practice portion control. If your plate is smaller, less food can fit on it.  Try not to multitask while eating, such as watching TV or using your computer. If it is time to eat, sit down at a table and enjoy your food. Doing this will help you to start recognizing when you are full. It will also make you more aware of what and how much you are eating. HOW CAN I CALORIE COUNT WHEN EATING OUT?  Ask for smaller portion sizes or child-sized portions.  Consider sharing an entree and sides instead of getting your own entree.  If you get your own entree, eat only half. Ask for a box at the beginning of your meal and put the rest of your entree in it so you are not tempted to eat it.  Look for the calories on the menu. If calories are listed, choose the lower calorie options.  Choose dishes that include vegetables, fruits, whole grains, low-fat dairy products, and lean protein. Focusing on smart food choices from each of the 5 food groups can help you stay on track at restaurants.  Choose items that are boiled, broiled, grilled, or steamed.  Choose water, milk, unsweetened iced tea, or other drinks without added sugars. If you want an alcoholic beverage, choose a lower calorie option. For example, a  regular margarita can have up to 700 calories and a glass of wine has around 150.  Stay away from items that are buttered, battered, fried, or served with cream sauce. Items labeled "crispy" are usually fried, unless stated otherwise.  Ask for dressings, sauces, and syrups on the side. These are usually very high in calories, so do not eat much of them.  Watch out for salads. Many people think salads are a healthy option, but this is often not the case. Many salads come with bacon, fried chicken, lots of cheese, fried chips, and dressing. All of these items have a lot of calories. If you want a salad, choose a garden salad and ask for grilled meats or steak. Ask for the dressing on the side, or ask for olive oil and vinegar or lemon to use as dressing.  Estimate how many servings of a food you are given. For example, a serving of cooked rice is  cup or about the size of half a tennis ball or one cupcake wrapper. Knowing serving sizes will help you be aware of how much food you are eating at restaurants. The list below tells you how big or small some common portion sizes are based on everyday objects.  1  oz--4 stacked dice.  3 oz--1 deck of cards.  1 tsp--1 dice.  1 Tbsp-- a Ping-Pong ball.  2 Tbsp--1 Ping-Pong ball.   cup--1 tennis ball or 1 cupcake wrapper.  1 cup--1 baseball. Document Released: 10/11/2005 Document Revised: 02/25/2014 Document Reviewed: 08/16/2013 [facility] Patient Information 2015 [place], [place]. This information is not intended to replace advice given to you by your health care provider. Make sure you discuss any questions you have with your health care provider.

## 2015-01-29 ENCOUNTER — Other Ambulatory Visit: Payer: Self-pay | Admitting: Obstetrics & Gynecology

## 2015-01-29 DIAGNOSIS — O3680X0 Pregnancy with inconclusive fetal viability, not applicable or unspecified: Secondary | ICD-10-CM

## 2015-01-30 ENCOUNTER — Other Ambulatory Visit: Payer: Self-pay | Admitting: Obstetrics & Gynecology

## 2015-01-30 ENCOUNTER — Other Ambulatory Visit: Payer: Medicaid Other

## 2015-01-30 ENCOUNTER — Ambulatory Visit (INDEPENDENT_AMBULATORY_CARE_PROVIDER_SITE_OTHER): Payer: Medicaid Other

## 2015-01-30 DIAGNOSIS — O3680X0 Pregnancy with inconclusive fetal viability, not applicable or unspecified: Secondary | ICD-10-CM

## 2015-01-30 DIAGNOSIS — O3680X1 Pregnancy with inconclusive fetal viability, fetus 1: Secondary | ICD-10-CM

## 2015-01-30 NOTE — Progress Notes (Signed)
US T/A and TV pelvic, no iup seen,eec .5cm ,normal ov's,small amount of complex cul de sac fluid seen,Pt is going to lab core after us, Victorino DikeJennifer will call pt tomorrow.

## 2015-01-31 ENCOUNTER — Telehealth: Payer: Self-pay | Admitting: Adult Health

## 2015-01-31 LAB — ABO/RH: RH TYPE: NEGATIVE

## 2015-01-31 LAB — BETA HCG QUANT (REF LAB): HCG QUANT: 18 m[IU]/mL

## 2015-01-31 NOTE — Telephone Encounter (Signed)
Pt aware QHCG 18 was 19.9 at [facility] 4/1 and blood type is A-, she had spotting but none now witl get back in on  Monday for rhogram and [facility]ck [facility] and see me

## 2015-02-03 ENCOUNTER — Ambulatory Visit (INDEPENDENT_AMBULATORY_CARE_PROVIDER_SITE_OTHER): Payer: Medicaid Other | Admitting: Adult Health

## 2015-02-03 ENCOUNTER — Encounter: Payer: Self-pay | Admitting: Adult Health

## 2015-02-03 VITALS — BP 94/62 | HR 92 | Ht 72.0 in | Wt 307.5 lb

## 2015-02-03 DIAGNOSIS — O200 Threatened abortion: Secondary | ICD-10-CM

## 2015-02-03 DIAGNOSIS — O360111 Maternal care for anti-D [Rh] antibodies, first trimester, fetus 1: Secondary | ICD-10-CM | POA: Diagnosis not present

## 2015-02-03 HISTORY — DX: Threatened abortion: O20.0

## 2015-02-03 MED ORDER — RHO D IMMUNE GLOBULIN 1500 UNIT/2ML IJ SOSY
300.0000 ug | PREFILLED_SYRINGE | Freq: Once | INTRAMUSCULAR | Status: AC
  Administered 2015-02-03: 300 ug via INTRAMUSCULAR

## 2015-02-03 NOTE — Progress Notes (Signed)
Subjective:     Patient ID: Heather Khan, female   DOB: 1986/01/09, 29 y.o.   MRN: [id number]  HPI Heather Khan is a  29 year old white female who had QHCG drop from 19.1 to 18 last week, in for rhogam and recheck QHCG, had spotting 1 day.Her blood type is A-.No spotting today, no pain.  Review of Systems Dropping [facility], all other systems negative Reviewed past medical,surgical, social and family history. Reviewed medications and allergies.     Objective:   Physical Exam BP 94/62 mmHg  Pulse 92  Ht 6' (1.829 m)  Wt 307 lb 8 oz (139.481 kg)  BMI 41.70 kg/m2  LMP  (LMP Unknown) Skin warm and dry. Neck: mid line trachea, normal thyroid, good ROM, no lymphadenopathy noted. Lungs: clear to ausculation bilaterally. 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    Assessment:   Probable miscarriage    Plan:     [facility] Will talk in am Review handout on miscarriage

## 2015-02-03 NOTE — Patient Instructions (Signed)
Will talk in am Miscarriage A miscarriage is the sudden loss of an unborn baby (fetus) before the 20th week of pregnancy. Most miscarriages happen in the first 3 months of pregnancy. Sometimes, it happens before a woman even knows she is pregnant. A miscarriage is also called a "spontaneous miscarriage" or "early pregnancy loss." Having a miscarriage can be an emotional experience. Talk with your caregiver about any questions you may have about miscarrying, the grieving process, and your future pregnancy plans. CAUSES   Problems with the fetal chromosomes that make it impossible for the baby to develop normally. Problems with the baby's genes or chromosomes are most often the result of errors that occur, by chance, as the embryo divides and grows. The problems are not inherited from the parents.  Infection of the cervix or uterus.   Hormone problems.   Problems with the cervix, such as having an incompetent cervix. This is when the tissue in the cervix is not strong enough to hold the pregnancy.   Problems with the uterus, such as an abnormally shaped uterus, uterine fibroids, or congenital abnormalities.   Certain medical conditions.   Smoking, drinking alcohol, or taking illegal drugs.   Trauma.  Often, the cause of a miscarriage is unknown.  SYMPTOMS   Vaginal bleeding or spotting, with or without cramps or pain.  Pain or cramping in the abdomen or lower back.  Passing fluid, tissue, or blood clots from the vagina. DIAGNOSIS  Your caregiver will perform a physical exam. You may also have an ultrasound to confirm the miscarriage. Blood or urine tests may also be ordered. TREATMENT   Sometimes, treatment is not necessary if you naturally pass all the fetal tissue that was in the uterus. If some of the fetus or placenta remains in the body (incomplete miscarriage), tissue left behind may become infected and must be removed. Usually, a dilation and curettage (D and C) procedure  is performed. During a D and C procedure, the cervix is widened (dilated) and any remaining fetal or placental tissue is gently removed from the uterus.  Antibiotic medicines are prescribed if there is an infection. Other medicines may be given to reduce the size of the uterus (contract) if there is a lot of bleeding.  If you have Rh negative blood and your baby was Rh positive, you will need a Rh immunoglobulin shot. This shot will protect any future baby from having Rh blood problems in future pregnancies. HOME CARE INSTRUCTIONS   Your caregiver may order bed rest or may allow you to continue light activity. Resume activity as directed by your caregiver.  Have someone help with home and family responsibilities during this time.   Keep track of the number of sanitary pads you use each day and how soaked (saturated) they are. Write down this information.   Do not use tampons. Do not douche or have sexual intercourse until approved by your caregiver.   Only take over-the-counter or prescription medicines for pain or discomfort as directed by your caregiver.   Do not take aspirin. Aspirin can cause bleeding.   Keep all follow-up appointments with your caregiver.   If you or your partner have problems with grieving, talk to your caregiver or seek counseling to help cope with the pregnancy loss. Allow enough time to grieve before trying to get pregnant again.  SEEK IMMEDIATE MEDICAL CARE IF:   You have severe cramps or pain in your back or abdomen.  You have a fever.  You pass  large blood clots (walnut-sized or larger) ortissue from your vagina. Save any tissue for your caregiver to inspect.   Your bleeding increases.   You have a thick, bad-smelling vaginal discharge.  You become lightheaded, weak, or you faint.   You have chills.  MAKE SURE YOU:  Understand these instructions.  Will watch your condition.  Will get help right away if you are not doing well or get  worse. Document Released: 04/06/2001 Document Revised: 02/05/2013 Document Reviewed: 11/30/2011 [facility] Patient Information 2015 [place], [place]. This information is not intended to replace advice given to you by your health care provider. Make sure you discuss any questions you have with your health care provider.

## 2015-02-04 ENCOUNTER — Telehealth: Payer: Self-pay | Admitting: Adult Health

## 2015-02-04 LAB — BETA HCG QUANT (REF LAB): hCG Quant: 15 m[IU]/mL

## 2015-02-04 NOTE — Telephone Encounter (Signed)
Pt aware QHCG 15 it is dropping so this is miscarriage will recheck QHCG in 1 week

## 2015-03-20 LAB — US OB COMP LESS 14 WKS

## 2015-03-20 LAB — US OB TRANSVAGINAL

## 2015-05-09 ENCOUNTER — Telehealth: Payer: Self-pay | Admitting: Nurse Practitioner

## 2015-05-09 NOTE — Telephone Encounter (Signed)
Pt wanted appt for boils Will come in tomorrow to urgent clinic appt scheduled for depression

## 2015-05-16 ENCOUNTER — Ambulatory Visit (INDEPENDENT_AMBULATORY_CARE_PROVIDER_SITE_OTHER): Payer: Medicaid Other | Admitting: Family Medicine

## 2015-05-16 ENCOUNTER — Encounter: Payer: Self-pay | Admitting: Family Medicine

## 2015-05-16 VITALS — BP 110/73 | HR 98 | Temp 98.4°F | Ht 72.0 in | Wt 304.0 lb

## 2015-05-16 DIAGNOSIS — F339 Major depressive disorder, recurrent, unspecified: Secondary | ICD-10-CM | POA: Insufficient documentation

## 2015-05-16 DIAGNOSIS — F32A Depression, unspecified: Secondary | ICD-10-CM | POA: Insufficient documentation

## 2015-05-16 DIAGNOSIS — F329 Major depressive disorder, single episode, unspecified: Secondary | ICD-10-CM | POA: Insufficient documentation

## 2015-05-16 MED ORDER — DULOXETINE HCL 30 MG PO CPEP: 30.0000 mg | ORAL_CAPSULE | Freq: Every day | ORAL | Status: DC
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
## 2015-06-09 ENCOUNTER — Ambulatory Visit: Payer: Medicaid Other

## 2015-06-20 ENCOUNTER — Emergency Department (HOSPITAL_BASED_OUTPATIENT_CLINIC_OR_DEPARTMENT_OTHER)
Admission: EM | Admit: 2015-06-20 | Discharge: 2015-06-20 | Disposition: A | Discharge status: Home / Self Care | Payer: Medicaid Other | Attending: Emergency Medicine | Admitting: Emergency Medicine

## 2015-06-20 ENCOUNTER — Encounter (HOSPITAL_BASED_OUTPATIENT_CLINIC_OR_DEPARTMENT_OTHER): Payer: Self-pay | Admitting: *Deleted

## 2015-06-20 ENCOUNTER — Emergency Department (HOSPITAL_BASED_OUTPATIENT_CLINIC_OR_DEPARTMENT_OTHER): Payer: Medicaid Other

## 2015-06-20 DIAGNOSIS — Y9289 Other specified places as the place of occurrence of the external cause: Secondary | ICD-10-CM | POA: Diagnosis not present

## 2015-06-20 DIAGNOSIS — S3992XA Unspecified injury of lower back, initial encounter: Secondary | ICD-10-CM | POA: Diagnosis present

## 2015-06-20 DIAGNOSIS — F319 Bipolar disorder, unspecified: Secondary | ICD-10-CM | POA: Diagnosis not present

## 2015-06-20 DIAGNOSIS — Z8739 Personal history of other diseases of the musculoskeletal system and connective tissue: Secondary | ICD-10-CM | POA: Diagnosis not present

## 2015-06-20 DIAGNOSIS — S39012A Strain of muscle, fascia and tendon of lower back, initial encounter: Secondary | ICD-10-CM | POA: Insufficient documentation

## 2015-06-20 DIAGNOSIS — Y9389 Activity, other specified: Secondary | ICD-10-CM | POA: Insufficient documentation

## 2015-06-20 DIAGNOSIS — Z79899 Other long term (current) drug therapy: Secondary | ICD-10-CM | POA: Diagnosis not present

## 2015-06-20 DIAGNOSIS — F419 Anxiety disorder, unspecified: Secondary | ICD-10-CM | POA: Insufficient documentation

## 2015-06-20 DIAGNOSIS — Z880 Allergy status to penicillin: Secondary | ICD-10-CM | POA: Insufficient documentation

## 2015-06-20 DIAGNOSIS — Z720 Tobacco use: Secondary | ICD-10-CM | POA: Diagnosis not present

## 2015-06-20 DIAGNOSIS — W108XXA Fall (on) (from) other stairs and steps, initial encounter: Secondary | ICD-10-CM | POA: Diagnosis not present

## 2015-06-20 DIAGNOSIS — Y998 Other external cause status: Secondary | ICD-10-CM | POA: Insufficient documentation

## 2015-06-20 MED ORDER — NAPROXEN 250 MG PO TABS
500.0000 mg | ORAL_TABLET | Freq: Once | ORAL | Status: AC
  Administered 2015-06-20: 500 mg via ORAL
  Filled 2015-06-20: qty 2

## 2015-06-20 MED ORDER — NAPROXEN 500 MG PO TABS: 500.0000 mg | ORAL_TABLET | Freq: Two times a day (BID) | ORAL | Status: DC

## 2015-06-20 NOTE — ED Provider Notes (Signed)
CSN: [id number]     Arrival date & time 06/20/15  1644 History   First MD Initiated Contact with Patient 06/20/15 1650     Chief Complaint  Patient presents with  . Back Pain     (Consider location/radiation/quality/duration/timing/severity/associated sxs/prior Treatment) HPI Comments: 29 year old female complaining of low back pain after missing a step and falling down 6 steps last night. States she "tweaked" her back when she landed. No head injury. Pain worsened when she woke up this morning, 5/10 sitting, increasing to 10/10 when she bends forward. Denies pain, numbness or tingling radiating down her extremities. Tried using freeze spray without relief. No loss control bowels or bladder saddle anesthesia.  Patient is a 29 y.o. female presenting with back pain. The history is provided by the patient.  Back Pain   Past Medical History  Diagnosis Date  . Anxiety     Bipolar--takes no meds  . PTSD (post-traumatic stress disorder)   . Bipolar 1 disorder   . OCD (obsessive compulsive disorder)   . Plantar fasciitis, bilateral   . Miscarriage, threatened, early pregnancy 02/03/2015   Past Surgical History  Procedure Laterality Date  . Appendectomy    . Finger surgery      right pinkie finger  has  2 pins in it  . Multiple extractions with alveoloplasty N/A 07/02/2013    Procedure: MULTIPLE EXTRACION 3, 4, 5, 8, 9, 10, 11, 12, 14, 18, 19, 29, 30 WITH ALVEOLOPLASTY;  Surgeon: Georgia Lopes, DDS;  Location: [facility] OR;  Service: Oral Surgery;  Laterality: N/A;   Family History  Problem Relation Age of Onset  . Hypertension Mother   . Diabetes Father   . Healthy Sister   . Healthy Son   . Healthy Sister   . Kidney disease Maternal Grandfather   . Diabetes Maternal Grandfather   . Cancer Maternal Grandmother   . Stroke Paternal Grandfather   . Heart attack Paternal Grandfather    Social History  Substance Use Topics  . Smoking status: Current Every Day Smoker -- 2.00 packs/day for  12 years    Types: Cigarettes  . Smokeless tobacco: Never Used  . Alcohol Use: No     Comment: rarely; not now   OB History    Gravida Para Term Preterm AB TAB SAB Ectopic Multiple Living   3 1   1  1         Review of Systems  Musculoskeletal: Positive for back pain.  All other systems reviewed and are negative.     Allergies  Erythromycin; Iohexol; and Penicillins  Home Medications   Prior to Admission medications   Medication Sig Start Date End Date Taking? Authorizing Provider  DULoxetine (CYMBALTA) 30 MG capsule Take 1 capsule (30 mg total) by mouth daily. For one week then two daily. Take with a full stomach at suppertime 05/16/15   Mechele Claude, MD  naproxen (NAPROSYN) 500 MG tablet Take 1 tablet (500 mg total) by mouth 2 (two) times daily. 06/20/15   Katrin Grabel M Shakari Qazi, PA-C   BP 132/75 mmHg  Pulse 94  Temp(Src) 98.1 F (36.7 C) (Oral)  Resp 16  Ht 6\' 1"  (1.854 m)  Wt 304 lb 6 oz (138.064 kg)  BMI 40.17 kg/m2  SpO2 95%  LMP 05/09/2015 Physical Exam  Constitutional: She is oriented to person, place, and time. She appears well-developed and well-nourished. No distress.  Morbidly obese.  HENT:  Head: Normocephalic and atraumatic.  Mouth/Throat: Oropharynx is clear and moist.  Eyes: Conjunctivae are normal.  Neck: Normal range of motion. Neck supple. No spinous process tenderness and no muscular tenderness present.  Cardiovascular: Normal rate, regular rhythm and normal heart sounds.   Pulmonary/Chest: Effort normal and breath sounds normal. No respiratory distress.  Musculoskeletal: She exhibits no edema.       Lumbar back: She exhibits decreased range of motion (with flexion, limited by pain), tenderness and bony tenderness. She exhibits no swelling, no edema and normal pulse.  Neurological: She is alert and oriented to person, place, and time. She has normal strength.  Strength lower extremities 5/5 and equal bilateral. Sensation intact. Normal gait.  Skin: Skin  is warm and dry. No rash noted. She is not diaphoretic.  Psychiatric: She has a normal mood and affect. Her behavior is normal.  Nursing note and vitals reviewed.   ED Course  Procedures (including critical care time) Labs Review Labs Reviewed - No data to display  Imaging Review Dg Lumbar Spine Complete  06/20/2015   CLINICAL DATA:  Pt fell down steps yesterday, pain in lower back, non-radiating pain, no previous lumbar surgeries  EXAM: LUMBAR SPINE - COMPLETE 4+ VIEW  COMPARISON:  08/01/2009  FINDINGS: There is no evidence of lumbar spine fracture. Alignment is normal. Intervertebral disc spaces are maintained.  IMPRESSION: Negative.   Electronically Signed   By: Amie Portland M.D.   On: 06/20/2015 17:28   I have personally reviewed and evaluated these images and lab results as part of my medical decision-making.   EKG Interpretation None      MDM   Final diagnoses:  Fall down stairs, initial encounter  Lumbar strain, initial encounter   Back pain after mechanical injury. X-ray negative. No red flags concerning patient's back pain. No s/s of central cord compression or cauda equina. Lower extremities are neurovascularly intact and patient is ambulating without difficulty. Advised rest, ice/heat, NSAIDs. Follow-up with PCP. Stable for discharge. Return precautions given. Patient states understanding of treatment care plan and is agreeable.   Kathrynn Speed, PA-C 06/20/15 1758  Tilden Fossa, MD 06/20/15 [phone]

## 2015-06-20 NOTE — ED Notes (Signed)
Fell down 6 steps last night. Injury to her lower back.

## 2015-06-20 NOTE — Discharge Instructions (Signed)
Take naproxen as prescribed. Rest, apply ice intermittently for the next 24 hours followed by heat. Avoid heavy lifting or hard physical activity.  Back Pain, Adult Low back pain is very common. About 1 in 5 people have back pain.The cause of low back pain is rarely dangerous. The pain often gets better over time.About half of people with a sudden onset of back pain feel better in just 2 weeks. About 8 in 10 people feel better by 6 weeks.  CAUSES Some common causes of back pain include:  Strain of the muscles or ligaments supporting the spine.  Wear and tear (degeneration) of the spinal discs.  Arthritis.  Direct injury to the back. DIAGNOSIS Most of the time, the direct cause of low back pain is not known.However, back pain can be treated effectively even when the exact cause of the pain is unknown.Answering your caregiver's questions about your overall health and symptoms is one of the most accurate ways to make sure the cause of your pain is not dangerous. If your caregiver needs more information, he or she may order lab work or imaging tests (X-rays or MRIs).However, even if imaging tests show changes in your back, this usually does not require surgery. HOME CARE INSTRUCTIONS For many people, back pain returns.Since low back pain is rarely dangerous, it is often a condition that people can learn to [facility] their own.   Remain active. It is stressful on the back to sit or stand in one place. Do not sit, drive, or stand in one place for more than 30 minutes at a time. Take short walks on level surfaces as soon as pain allows.Try to increase the length of time you walk each day.  Do not stay in bed.Resting more than 1 or 2 days can delay your recovery.  Do not avoid exercise or work.Your body is made to move.It is not dangerous to be active, even though your back may hurt.Your back will likely heal faster if you return to being active before your pain is gone.  Pay attention to  your body when you bend and lift. Many people have less discomfortwhen lifting if they bend their knees, keep the load close to their bodies,and avoid twisting. Often, the most comfortable positions are those that put less stress on your recovering back.  Find a comfortable position to sleep. Use a firm mattress and lie on your side with your knees slightly bent. If you lie on your back, put a pillow under your knees.  Only take over-the-counter or prescription medicines as directed by your caregiver. Over-the-counter medicines to reduce pain and inflammation are often the most helpful.Your caregiver may prescribe muscle relaxant drugs.These medicines help dull your pain so you can more quickly return to your normal activities and healthy exercise.  Put ice on the injured area.  Put ice in a plastic bag.  Place a towel between your skin and the bag.  Leave the ice on for 15-20 minutes, 03-04 times a day for the first 2 to 3 days. After that, ice and heat may be alternated to reduce pain and spasms.  Ask your caregiver about trying back exercises and gentle massage. This may be of some benefit.  Avoid feeling anxious or stressed.Stress increases muscle tension and can worsen back pain.It is important to recognize when you are anxious or stressed and learn ways to manage it.Exercise is a great option. SEEK MEDICAL CARE IF:  You have pain that is not relieved with rest or medicine.  You have pain that does not improve in 1 week.  You have new symptoms.  You are generally not feeling well. SEEK IMMEDIATE MEDICAL CARE IF:   You have pain that radiates from your back into your legs.  You develop new bowel or bladder control problems.  You have unusual weakness or numbness in your arms or legs.  You develop nausea or vomiting.  You develop abdominal pain.  You feel faint. Document Released: 10/11/2005 Document Revised: 04/11/2012 Document Reviewed: 02/12/2014 [facility]  Patient Information 2015 [place], [place]. This information is not intended to replace advice given to you by your health care provider. Make sure you discuss any questions you have with your health care provider.  Muscle Strain A muscle strain is an injury that occurs when a muscle is stretched beyond its normal length. Usually a small number of muscle fibers are torn when this happens. Muscle strain is rated in degrees. First-degree strains have the least amount of muscle fiber tearing and pain. Second-degree and third-degree strains have increasingly more tearing and pain.  Usually, recovery from muscle strain takes 1-2 weeks. Complete healing takes 5-6 weeks.  CAUSES  Muscle strain happens when a sudden, violent force placed on a muscle stretches it too far. This may occur with lifting, sports, or a fall.  RISK FACTORS Muscle strain is especially common in athletes.  SIGNS AND SYMPTOMS At the site of the muscle strain, there may be:  Pain.  Bruising.  Swelling.  Difficulty using the muscle due to pain or lack of normal function. DIAGNOSIS  Your health care provider will perform a physical exam and ask about your medical history. TREATMENT  Often, the best treatment for a muscle strain is resting, icing, and applying cold compresses to the injured area.  HOME CARE INSTRUCTIONS   Use the PRICE method of treatment to promote muscle healing during the first 2-3 days after your injury. The PRICE method involves:  Protecting the muscle from being injured again.  Restricting your activity and resting the injured body part.  Icing your injury. To do this, put ice in a plastic bag. Place a towel between your skin and the bag. Then, apply the ice and leave it on from 15-20 minutes each hour. After the third day, switch to moist heat packs.  Apply compression to the injured area with a splint or elastic bandage. Be careful not to wrap it too tightly. This may interfere with blood circulation  or increase swelling.  Elevate the injured body part above the level of your heart as often as you can.  Only take over-the-counter or prescription medicines for pain, discomfort, or fever as directed by your health care provider.  Warming up prior to exercise helps to prevent future muscle strains. SEEK MEDICAL CARE IF:   You have increasing pain or swelling in the injured area.  You have numbness, tingling, or a significant loss of strength in the injured area. MAKE SURE YOU:   Understand these instructions.  Will watch your condition.  Will get help right away if you are not doing well or get worse. Document Released: 10/11/2005 Document Revised: 08/01/2013 Document Reviewed: 05/10/2013 [facility] Patient Information 2015 [place], [place]. This information is not intended to replace advice given to you by your health care provider. Make sure you discuss any questions you have with your health care provider.

## 2015-06-22 ENCOUNTER — Encounter (HOSPITAL_BASED_OUTPATIENT_CLINIC_OR_DEPARTMENT_OTHER): Payer: Self-pay

## 2015-06-22 ENCOUNTER — Emergency Department (HOSPITAL_BASED_OUTPATIENT_CLINIC_OR_DEPARTMENT_OTHER)
Admission: EM | Admit: 2015-06-22 | Discharge: 2015-06-22 | Disposition: A | Discharge status: Home / Self Care | Payer: Medicaid Other | Attending: Emergency Medicine | Admitting: Emergency Medicine

## 2015-06-22 DIAGNOSIS — Y9301 Activity, walking, marching and hiking: Secondary | ICD-10-CM | POA: Insufficient documentation

## 2015-06-22 DIAGNOSIS — Y998 Other external cause status: Secondary | ICD-10-CM | POA: Insufficient documentation

## 2015-06-22 DIAGNOSIS — F319 Bipolar disorder, unspecified: Secondary | ICD-10-CM | POA: Diagnosis not present

## 2015-06-22 DIAGNOSIS — Z720 Tobacco use: Secondary | ICD-10-CM | POA: Diagnosis not present

## 2015-06-22 DIAGNOSIS — T63301A Toxic effect of unspecified spider venom, accidental (unintentional), initial encounter: Secondary | ICD-10-CM

## 2015-06-22 DIAGNOSIS — F419 Anxiety disorder, unspecified: Secondary | ICD-10-CM | POA: Insufficient documentation

## 2015-06-22 DIAGNOSIS — Y9289 Other specified places as the place of occurrence of the external cause: Secondary | ICD-10-CM | POA: Diagnosis not present

## 2015-06-22 DIAGNOSIS — Z8739 Personal history of other diseases of the musculoskeletal system and connective tissue: Secondary | ICD-10-CM | POA: Insufficient documentation

## 2015-06-22 DIAGNOSIS — Z880 Allergy status to penicillin: Secondary | ICD-10-CM | POA: Insufficient documentation

## 2015-06-22 MED ORDER — MUPIROCIN CALCIUM 2 % EX CREA
TOPICAL_CREAM | Freq: Three times a day (TID) | CUTANEOUS | Status: DC
  Administered 2015-06-22: 02:00:00 via TOPICAL
  Filled 2015-06-22: qty 15
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
## 2015-06-28 ENCOUNTER — Encounter (HOSPITAL_BASED_OUTPATIENT_CLINIC_OR_DEPARTMENT_OTHER): Payer: Self-pay | Admitting: Emergency Medicine

## 2015-06-28 ENCOUNTER — Emergency Department (HOSPITAL_BASED_OUTPATIENT_CLINIC_OR_DEPARTMENT_OTHER)
Admission: EM | Admit: 2015-06-28 | Discharge: 2015-06-28 | Disposition: A | Discharge status: Home / Self Care | Payer: Medicaid Other | Attending: Emergency Medicine | Admitting: Emergency Medicine

## 2015-06-28 DIAGNOSIS — F319 Bipolar disorder, unspecified: Secondary | ICD-10-CM | POA: Diagnosis not present

## 2015-06-28 DIAGNOSIS — Z791 Long term (current) use of non-steroidal anti-inflammatories (NSAID): Secondary | ICD-10-CM | POA: Insufficient documentation

## 2015-06-28 DIAGNOSIS — Z720 Tobacco use: Secondary | ICD-10-CM | POA: Insufficient documentation

## 2015-06-28 DIAGNOSIS — Z880 Allergy status to penicillin: Secondary | ICD-10-CM | POA: Diagnosis not present

## 2015-06-28 DIAGNOSIS — F419 Anxiety disorder, unspecified: Secondary | ICD-10-CM | POA: Diagnosis not present

## 2015-06-28 DIAGNOSIS — Z8739 Personal history of other diseases of the musculoskeletal system and connective tissue: Secondary | ICD-10-CM | POA: Diagnosis not present

## 2015-06-28 DIAGNOSIS — R1033 Periumbilical pain: Secondary | ICD-10-CM | POA: Diagnosis present

## 2015-06-28 DIAGNOSIS — R197 Diarrhea, unspecified: Secondary | ICD-10-CM | POA: Insufficient documentation

## 2015-06-28 DIAGNOSIS — Z3202 Encounter for pregnancy test, result negative: Secondary | ICD-10-CM | POA: Insufficient documentation

## 2015-06-28 LAB — CBC WITH DIFFERENTIAL/PLATELET
BASOS PCT: 0 % (ref 0–1)
Basophils Absolute: 0 10*3/uL (ref 0.0–0.1)
EOS ABS: 0.2 10*3/uL (ref 0.0–0.7)
EOS PCT: 3 % (ref 0–5)
HCT: 40.9 % (ref 36.0–46.0)
Hemoglobin: 13.1 g/dL (ref 12.0–15.0)
LYMPHS PCT: 38 % (ref 12–46)
Lymphs Abs: 3 10*3/uL (ref 0.7–4.0)
MCH: 27.6 pg (ref 26.0–34.0)
MCHC: 32 g/dL (ref 30.0–36.0)
MCV: 86.3 fL (ref 78.0–100.0)
Monocytes Absolute: 0.7 10*3/uL (ref 0.1–1.0)
Monocytes Relative: 8 % (ref 3–12)
Neutro Abs: 4.1 10*3/uL (ref 1.7–7.7)
Neutrophils Relative %: 51 % (ref 43–77)
PLATELETS: 297 10*3/uL (ref 150–400)
RBC: 4.74 MIL/uL (ref 3.87–5.11)
RDW: 15.2 % (ref 11.5–15.5)
WBC: 8 10*3/uL (ref 4.0–10.5)

## 2015-06-28 LAB — COMPREHENSIVE METABOLIC PANEL
ALT: 43 U/L (ref 14–54)
AST: 30 U/L (ref 15–41)
Albumin: 3.8 g/dL (ref 3.5–5.0)
Alkaline Phosphatase: 96 U/L (ref 38–126)
Anion gap: 8 (ref 5–15)
BUN: 10 mg/dL (ref 6–20)
CHLORIDE: 107 mmol/L (ref 101–111)
CO2: 26 mmol/L (ref 22–32)
CREATININE: 0.88 mg/dL (ref 0.44–1.00)
Calcium: 9 mg/dL (ref 8.9–10.3)
Glucose, Bld: 115 mg/dL — ABNORMAL HIGH (ref 65–99)
Potassium: 3.5 mmol/L (ref 3.5–5.1)
Sodium: 141 mmol/L (ref 135–145)
TOTAL PROTEIN: 7.1 g/dL (ref 6.5–8.1)
Total Bilirubin: 0.5 mg/dL (ref 0.3–1.2)

## 2015-06-28 LAB — URINE MICROSCOPIC-ADD ON

## 2015-06-28 LAB — URINALYSIS, ROUTINE W REFLEX MICROSCOPIC
Bilirubin Urine: NEGATIVE
GLUCOSE, UA: NEGATIVE mg/dL
HGB URINE DIPSTICK: NEGATIVE
KETONES UR: NEGATIVE mg/dL
Nitrite: NEGATIVE
PROTEIN: NEGATIVE mg/dL
Specific Gravity, Urine: 1.025 (ref 1.005–1.030)
Urobilinogen, UA: 0.2 mg/dL (ref 0.0–1.0)
pH: 5.5 (ref 5.0–8.0)

## 2015-06-28 LAB — PREGNANCY, URINE: PREG TEST UR: NEGATIVE

## 2015-06-28 LAB — LIPASE, BLOOD: LIPASE: 31 U/L (ref 22–51)

## 2015-06-28 NOTE — Discharge Instructions (Signed)

## 2015-06-28 NOTE — ED Notes (Signed)
Patient reports that she is having intermittent "shocking" pain to her "belly button" x about a week. The patient reports that today it had become steady and all the time. The patient denies any N/V  - reports that she might be pregnant.

## 2015-06-28 NOTE — ED Provider Notes (Signed)
CSN: [id number]     Arrival date & time 06/28/15  0109 History   First MD Initiated Contact with Patient 06/28/15 0115     Chief Complaint  Patient presents with  . Abdominal Pain     (Consider location/radiation/quality/duration/timing/severity/associated sxs/prior Treatment) HPI  29 year old female presents with intermittent periumbilical abdominal pain over the past 2 days. A few days ago she was walking in the woods and states that she got poison ivy and also multiple chigger bites. She thinks one may have bitten her in her bellybutton because it hurts to probe in that area. Any type of touching, especially left of her umbilicus or coughing or bending over causes her pain. Otherwise there is minimal to no pain. Has been having multiple loose watery stools several times per day over the last 3 days. Denies nausea, vomiting, urinary symptoms, vaginal bleeding or discharge, or a missed menstrual cycle. She has irregular menstrual cycles, last one in July. No fevers. No back pain. Any time she probes the umbilicus it hurts. No swelling.  Past Medical History  Diagnosis Date  . Anxiety     Bipolar--takes no meds  . PTSD (post-traumatic stress disorder)   . Bipolar 1 disorder   . OCD (obsessive compulsive disorder)   . Plantar fasciitis, bilateral   . Miscarriage, threatened, early pregnancy 02/03/2015   Past Surgical History  Procedure Laterality Date  . Appendectomy    . Finger surgery      right pinkie finger  has  2 pins in it  . Multiple extractions with alveoloplasty N/A 07/02/2013    Procedure: MULTIPLE EXTRACION 3, 4, 5, 8, 9, 10, 11, 12, 14, 18, 19, 29, 30 WITH ALVEOLOPLASTY;  Surgeon: Georgia Lopes, DDS;  Location: [facility] OR;  Service: Oral Surgery;  Laterality: N/A;   Family History  Problem Relation Age of Onset  . Hypertension Mother   . Diabetes Father   . Healthy Sister   . Healthy Son   . Healthy Sister   . Kidney disease Maternal Grandfather   . Diabetes Maternal  Grandfather   . Cancer Maternal Grandmother   . Stroke Paternal Grandfather   . Heart attack Paternal Grandfather    Social History  Substance Use Topics  . Smoking status: Current Every Day Smoker -- 2.00 packs/day for 12 years    Types: Cigarettes  . Smokeless tobacco: Never Used  . Alcohol Use: No     Comment: rarely; not now   OB History    Gravida Para Term Preterm AB TAB SAB Ectopic Multiple Living   3 1   1  1         Review of Systems  Constitutional: Negative for fever.  Gastrointestinal: Positive for abdominal pain and diarrhea. Negative for nausea and vomiting.  Genitourinary: Negative for dysuria, vaginal bleeding, vaginal discharge and menstrual problem.  Musculoskeletal: Negative for back pain.  All other systems reviewed and are negative.     Allergies  Erythromycin; Iohexol; Penicillins; and Zoloft  Home Medications   Prior to Admission medications   Medication Sig Start Date End Date Taking? Authorizing Provider  DULoxetine (CYMBALTA) 30 MG capsule Take 1 capsule (30 mg total) by mouth daily. For one week then two daily. Take with a full stomach at suppertime 05/16/15   Mechele Claude, MD  naproxen (NAPROSYN) 500 MG tablet Take 1 tablet (500 mg total) by mouth 2 (two) times daily. 06/20/15   Robyn M Hess, PA-C   BP 109/71 mmHg  Pulse 92  Temp(Src) 98 F (36.7 C) (Oral)  Resp 16  Ht  (1.854 m)  Wt 300 lb 3.2 oz (136.17 kg)  BMI 39.62 kg/m2  SpO2 100%  LMP 05/09/2015 Physical Exam  Constitutional: She is oriented to person, place, and time. She appears well-developed and well-nourished.  HENT:  Head: Normocephalic and atraumatic.  Right Ear: External ear normal.  Left Ear: External ear normal.  Nose: Nose normal.  Eyes: Right eye exhibits no discharge. Left eye exhibits no discharge.  Cardiovascular: Normal rate, regular rhythm and normal heart sounds.   Pulmonary/Chest: Effort normal and breath sounds normal.  Abdominal: Soft. There is  tenderness in the periumbilical area.    Multiple lesions over abdomen consistent with insect stings/bites. No obvious bite or lesion in umbilicus as far as I can see. No palpable abnormality.  Neurological: She is alert and oriented to person, place, and time.  Skin: Skin is warm and dry.  Nursing note and vitals reviewed.   ED Course  Procedures (including critical care time) Labs Review Labs Reviewed  URINALYSIS, ROUTINE W REFLEX MICROSCOPIC (NOT AT [facility]) - Abnormal; Notable for the following:    APPearance CLOUDY (*)    Leukocytes, UA SMALL (*)    All other components within normal limits  COMPREHENSIVE METABOLIC PANEL - Abnormal; Notable for the following:    Glucose, Bld 115 (*)    All other components within normal limits  URINE MICROSCOPIC-ADD ON - Abnormal; Notable for the following:    Squamous Epithelial / LPF MANY (*)    Bacteria, UA MANY (*)    Crystals CA OXALATE CRYSTALS (*)    All other components within normal limits  PREGNANCY, URINE  LIPASE, BLOOD  CBC WITH DIFFERENTIAL/PLATELET    Imaging Review No results found. I have personally reviewed and evaluated these images and lab results as part of my medical decision-making.   EKG Interpretation None      MDM   Final diagnoses:  Periumbilical abdominal pain    Patient's abdominal pain is of unclear etiology. Workup here is negative including no pregnancy and lab work is unremarkable. Has dirty catch urine but no UTI symptoms, do not feel she needs treatment at this time. No suprapubic pain. No obvious hernia although given location this could be a cause of some pain. There is no sign of incarceration or obstruction and thus I do not feel imaging is warranted at this time. Appears quite comfortable. Discussed conservative measures as well as follow up with PCP. If symptoms are worsening, advised to return to ER for possible imaging and reevaluation.    Pricilla Loveless, MD 06/28/15 Earle Gell

## 2015-07-11 ENCOUNTER — Encounter: Payer: Self-pay | Admitting: Physician Assistant

## 2015-07-11 ENCOUNTER — Ambulatory Visit (INDEPENDENT_AMBULATORY_CARE_PROVIDER_SITE_OTHER): Payer: Medicaid Other | Admitting: Physician Assistant

## 2015-07-11 VITALS — BP 110/73 | HR 88 | Temp 97.0°F | Ht 73.0 in | Wt 298.0 lb

## 2015-07-11 DIAGNOSIS — A4902 Methicillin resistant Staphylococcus aureus infection, unspecified site: Secondary | ICD-10-CM | POA: Diagnosis not present

## 2015-07-11 DIAGNOSIS — B9562 Methicillin resistant Staphylococcus aureus infection as the cause of diseases classified elsewhere: Secondary | ICD-10-CM

## 2015-07-11 MED ORDER — DOXYCYCLINE HYCLATE 100 MG PO TABS: 100.0000 mg | ORAL_TABLET | Freq: Two times a day (BID) | ORAL | Status: DC

## 2015-07-11 NOTE — Progress Notes (Signed)
Patient ID: Heather Khan, female   DOB: 01/22/1986, 29 y.o.   MRN: [id number]  29 y/o female presents for boils that occur randomly on bottom, between thighs and breasts. She has a h/o MRSA infection 2010.   1. Infection of skin due to methicillin resistant Staphylococcus aureus (MRSA)  - doxycycline (VIBRA-TABS) 100 MG tablet; Take 1 tablet (100 mg total) by mouth 2 (two) times daily.  Dispense: 60 tablet; Refill: 0 - Aerobic culture  PE reveals multiple furuncles on abdomen, bottom and trunk with purulent drainage. Culture was taken and patient will be started on long term antibiotic treatment and bleach baths. I have discussed with her that she may need up to 3 months of antibiotic treatment for infection to become dormant. This can be reduced with bleach baths 2-3 times weekly.   Soak trunk and limbs in the bath for 10-15 minutes. Do not submerge head. Rinse off with warm tap water and pat dry with a towel. Avoid sharing towels. Apply topical steroid and moisturisers if prescribed. Bleach baths are recommended at least twice a week Keep bleach out of reach from children Soak trunk and limbs in the bath for 10-15 minutes. Do not submerge head. Rinse off with warm tap water and pat dry with a towel. Avoid sharing towels. Apply topical steroid and moisturisers if prescribed. Bleach baths are recommended at least twice a week Keep bleach out of reach from children How to make a bleach bath for adults: Fill tub half way (13 gallons of water) with lukewarm or warm water.  Mix  to  cup of unscented household bleach to the bath.  The bleach bath should be used below the neck line. Avoid contact with the eyes, face, mouth and nose. If these areas come in contact, quickly rinse the area off thoroughly with fresh water. Remember, the concentration of bleach is very low in a bleach bath.  Soak body, specifically the affected area, in the water solution for 10-20 minutes, or as directed by your physician.  Keep bleach solution out of eyes, mouth, and hair.  Rinse body thoroughly with freshwater.  Apply emollients or lotions as directed by your physician.  Repeat 2-3 times a week, or as your physician recommends  RTO 1 month   Juna Caban A. Chauncey Reading PA-C

## 2015-07-11 NOTE — Patient Instructions (Signed)
Soak trunk and limbs in the bath for 10-15 minutes. Do not submerge head. Rinse off with warm tap water and pat dry with a towel. Avoid sharing towels. Apply topical steroid and moisturisers if prescribed. Bleach baths are recommended at least twice a week Keep bleach out of reach from children How to make a bleach bath for adults: Fill tub half way (13 gallons of water) with lukewarm or warm water.  Mix  to  cup of unscented household bleach to the bath.  The bleach bath should be used below the neck line. Avoid contact with the eyes, face, mouth and nose. If these areas come in contact, quickly rinse the area off thoroughly with fresh water. Remember, the concentration of bleach is very low in a bleach bath.  Soak body, specifically the affected area, in the water solution for 10-20 minutes, or as directed by your physician. Keep bleach solution out of eyes, mouth, and hair.  Rinse body thoroughly with freshwater.  Apply emollients or lotions as directed by your physician.  Repeat 2-3 times a week, or as your physician recommends.                   MRSA Infection MRSA stands for methicillin-resistant Staphylococcus aureus. This type of infection is caused by Staphylococcus aureus bacteria that are no longer affected by the medicines used to kill them (drug resistant). Staphylococcus (staph) bacteria are normally found on the skin or in the nose of healthy people. In most cases, these bacteria do not cause infection. But if these resistant bacteria enter your body through a cut or sore, they can cause a serious infection on your skin or in other parts of your body. There is a slight chance that the staph on your skin or in your nose is MRSA. There are two types of MRSA infections:  Hospital-acquired MRSA is bacteria that you get in the hospital.  Community-acquired MRSA is bacteria that you get somewhere other than in a hospital. RISK FACTORS Hospital-acquired MRSA is more common. You  could be at risk for this infection if you are in the hospital and you:  Have surgery or a procedure.  Have an IV access or a catheter tube placed in your body.  Have weak resistance to germs (weakened immune system).  Are elderly.  Are on kidney dialysis. You could be at risk for community-acquired MRSA if you have a break in your skin and come into contact with MRSA. This may happen if you:  Play sports where there is skin-to-skin contact.  Live in a crowded setting, like a dormitory or a [employer].  Share towels, razors, or sports equipment with other people. SYMPTOMS  Symptoms of hospital-acquired MRSA depend on where MRSA has spread. Symptoms may include:  Wound infection.  Skin infection.  Rash.  Pneumonia.  Fever and chills.  Difficulty breathing.  Chest pain. Community-acquired MRSA is most likely to start as a scratch or cut that becomes infected. Symptoms may include:  A pus-filled pimple.  A boil on your skin.  Pus draining from your skin.  A sore (abscess) under your skin or somewhere in your body.  Fever with or without chills. DIAGNOSIS  The diagnosis of MRSA is made by taking a sample from an infected area and sending it to a lab for testing. A lab technician can grow (culture) MRSA and check it under a microscope. The cultured MRSA can be tested to see which type of antibiotic medicine will work to  treat it. Newer tests can identify MRSA more quickly by testing bacteria samples for MRSA genes. Your health care provider can diagnose MRSA using samples from:   Cuts or wounds in infected areas.  Nasal swabs.  Saliva or cough specimens from deep in the lungs (sputum).  Urine.  Blood. You may also have:  Imaging studies (such as X-ray or MRI) to check if the infection has spread to the lungs, bones, or joints.  A culture and sensitivity test of blood or fluids from inside the joints. TREATMENT  Treatment depends on how severe, deep,  or extensive the infection is. Very bad infections may require a hospital stay.  Some skin infections, such as a small boil or sore (abscess), may be treated by draining pus from the site of the infection.  More extensive surgery to drain pus may be necessary for deeper or more widespread soft tissue infections.  You may then have to take antibiotic medicine given by mouth or through a vein. You may start antibiotic treatment right away or after testing can be done to see what antibiotic medicine should be used. HOME CARE INSTRUCTIONS   Take your antibiotics as directed by your health care provider. Take the medicine as prescribed until it is finished.  Avoid close contact with those around you as much as possible. Do not use towels, razors, toothbrushes, bedding, or other items that will be used by others.  Wash your hands frequently for 15 seconds with soap and water. Dry your hands with a clean or disposable towel.  When you are not able to wash your hands, use hand sanitizer that is more than 60 percent alcohol.  Wash towels, sheets, or clothes in the washing machine with detergent and hot water. Dry them in a hot dryer.  Follow your health care provider's instructions for wound care. Wash your hands before and after changing your bandages.  Always shower after exercising.  Keep all cuts and scrapes clean and covered with a bandage.  Be sure to tell all your health care providers that you have MRSA so they are aware of your infection. SEEK MEDICAL CARE IF:  You have a cut, scrape, pimple, or boil that becomes red, swollen, or painful or has pus in it.  You have pus draining from your skin.  You have an abscess under your skin or somewhere in your body. SEEK IMMEDIATE MEDICAL CARE IF:   You have symptoms of a skin infection with a fever or chills.  You have trouble breathing.  You have chest pain.  You have a skin wound and you become nauseous or start vomiting. MAKE SURE  YOU:  Understand these instructions.  Will watch your condition.  Will get help right away if you are not doing well or get worse. Document Released: 10/11/2005 Document Revised: 10/16/2013 Document Reviewed: 08/03/2013 [facility] Patient Information 2015 [place], [place]. This information is not intended to replace advice given to you by your health care provider. Make sure you discuss any questions you have with your health care provider.

## 2015-07-14 LAB — AEROBIC CULTURE

## 2015-08-11 ENCOUNTER — Ambulatory Visit (INDEPENDENT_AMBULATORY_CARE_PROVIDER_SITE_OTHER): Payer: Medicaid Other | Admitting: Family

## 2015-08-11 ENCOUNTER — Ambulatory Visit: Payer: Medicaid Other | Admitting: Pediatrics

## 2015-08-11 ENCOUNTER — Encounter: Payer: Self-pay | Admitting: Family

## 2015-08-11 VITALS — BP 144/96 | HR 107 | Temp 98.4°F | Ht 73.0 in | Wt 296.2 lb

## 2015-08-11 DIAGNOSIS — L732 Hidradenitis suppurativa: Secondary | ICD-10-CM

## 2015-08-11 MED ORDER — SULFAMETHOXAZOLE-TRIMETHOPRIM 800-160 MG PO TABS: 1.0000 | ORAL_TABLET | Freq: Two times a day (BID) | ORAL | Status: DC

## 2015-08-11 NOTE — Progress Notes (Signed)
   Subjective:    Patient ID: Heather Khan, female    DOB: [birth year], 29 y.o.   MRN: [id number]  HPI Pt presents to the office today to recheck multiple abscesses in inner thighs, under bilateral breasts, and abdomen. Pt was seen in the office on 07/11/15 and given Doxycycline 100 mg BID for a total of one month. Pt states since starting the doxycycline she had not been able to tell a difference. PT states she has had these chronic abscesses since 2010. Pt states she has multiple scars.    Review of Systems  Constitutional: Negative.   HENT: Negative.   Eyes: Negative.   Respiratory: Negative.  Negative for shortness of breath.   Cardiovascular: Negative.  Negative for palpitations.  Gastrointestinal: Negative.   Endocrine: Negative.   Genitourinary: Negative.   Musculoskeletal: Negative.   Neurological: Negative.  Negative for headaches.  Hematological: Negative.   Psychiatric/Behavioral: Negative.   All other systems reviewed and are negative.      Objective:   Physical Exam  Constitutional: She is oriented to person, place, and time. She appears well-developed and well-nourished. No distress.  HENT:  Head: Normocephalic and atraumatic.  Eyes: Pupils are equal, round, and reactive to light.  Neck: Normal range of motion. Neck supple. No thyromegaly present.  Cardiovascular: Normal rate, regular rhythm, normal heart sounds and intact distal pulses.   No murmur heard. Pulmonary/Chest: Effort normal and breath sounds normal. No respiratory distress. She has no wheezes.  Abdominal: Soft. Bowel sounds are normal. She exhibits no distension. There is no tenderness.  Musculoskeletal: Normal range of motion. She exhibits no edema or tenderness.  Neurological: She is alert and oriented to person, place, and time. She has normal reflexes. No cranial nerve deficit.  Skin: Skin is warm and dry. Petechiae noted.  Psychiatric: She has a normal mood and affect. Her behavior is normal.  Judgment and thought content normal.  Vitals reviewed.   BP 144/96 mmHg  Pulse 107  Temp(Src) 98.4 F (36.9 C) (Oral)  Ht 6\' 1"  (1.854 m)  Wt 296 lb 3.2 oz (134.355 kg)  BMI 39.09 kg/m2       Assessment & Plan:  1. Hidradenitis suppurativa -Wear loose fitting clothing -Encourage weight lost -Do not pick or squeeze abscess -Bathe daily with hibiclens -RTO prn - Ambulatory referral to Dermatology - sulfamethoxazole-trimethoprim (BACTRIM DS,SEPTRA DS) 800-160 MG tablet; Take 1 tablet by mouth 2 (two) times daily.  Dispense: 20 tablet; Refill: 0  Jannifer Rodneyhristy Hawks, FNP

## 2015-08-11 NOTE — Patient Instructions (Addendum)
Hidradenitis Suppurativa Hidradenitis suppurativa is a Robart-term (chronic) skin disease that starts with blocked sweat glands or hair follicles. Bacteria may grow in these blocked openings of your skin. Hidradenitis suppurativa is like a severe form of acne that develops in areas of your body where acne would be unusual. It is most likely to affect the areas of your body where skin rubs against skin and becomes moist. This includes your:  Underarms.  Groin.  Genital areas.  Buttocks.  Upper thighs.  Breasts. Hidradenitis suppurativa may start out with small pimples. The pimples can develop into deep sores that break open (rupture) and drain pus. Over time your skin may thicken and become scarred. Hidradenitis suppurativa cannot be passed from person to person.  CAUSES  The exact cause of hidradenitis suppurativa is not known. This condition may be due to:  Female and female hormones. The condition is rare before and after puberty.  An overactive body defense system (immune system). Your immune system may overreact to the blocked hair follicles or sweat glands and cause swelling and pus-filled sores. RISK FACTORS You may have a higher risk of hidradenitis suppurativa if you:  Are a woman.  Are between ages 47 and 29.  Have a family history of hidradenitis suppurativa.  Have a personal history of acne.  Are overweight.  Smoke.  Take the drug lithium. SIGNS AND SYMPTOMS  The first signs of an outbreak are usually painful skin bumps that look like pimples. As the condition progresses:  Skin bumps may get bigger and grow deeper into the skin.  Bumps under the skin may rupture and drain smelly pus.  Skin may become itchy and infected.  Skin may thicken and scar.  Drainage may continue through tunnels under the skin (fistulas).  Walking and moving your arms can become painful. DIAGNOSIS  Your health care provider may diagnose hidradenitis suppurativa based on your medical  history and your signs and symptoms. A physical exam will also be done. You may need to see a health care provider who specializes in skin diseases (dermatologist). You may also have tests done to confirm the diagnosis. These can include:  Swabbing a sample of pus or drainage from your skin so it can be sent to the lab and tested for infection.  Blood tests to check for infection. TREATMENT  The same treatment will not work for everybody with hidradenitis suppurativa. Your treatment will depend on how severe your symptoms are. You may need to try several treatments to find what works best for you. Part of your treatment may include cleaning and bandaging (dressing) your wounds. You may also have to take medicines, such as the following:  Antibiotics.  Acne medicines.  Medicines to block or suppress the immune system.  A diabetes medicine (metformin) is sometimes used to treat this condition.  For women, birth control pills can sometimes help relieve symptoms. You may need surgery if you have a severe case of hidradenitis suppurativa that does not respond to medicine. Surgery may involve:   Using a laser to clear the skin and remove hair follicles.  Opening and draining deep sores.  Removing the areas of skin that are diseased and scarred. HOME CARE INSTRUCTIONS  Learn as much as you can about your disease, and work closely with your health care providers.  Take medicines only as directed by your health care provider.  If you were prescribed an antibiotic medicine, finish it all even if you start to feel better.  If you are  overweight, losing weight may be very helpful. Try to reach and maintain a healthy weight.  Do not use any tobacco products, including cigarettes, chewing tobacco, or electronic cigarettes. If you need help quitting, ask your health care provider.  Do not shave the areas where you get hidradenitis suppurativa.  Do not wear deodorant.  Wear loose-fitting  clothes.  Try not to overheat and get sweaty.  Take a daily bleach bath as directed by your health care provider.  Fill your bathtub halfway with water.  Pour in  cup of unscented household bleach.  Soak for 5-10 minutes.  Cover sore areas with a warm, clean washcloth (compress) for 5-10 minutes. SEEK MEDICAL CARE IF:   You have a flare-up of hidradenitis suppurativa.  You have chills or a fever.  You are having trouble controlling your symptoms at home.   This information is not intended to replace advice given to you by your health care provider. Make sure you discuss any questions you have with your health care provider.   Document Released: 05/25/2004 Document Revised: 11/01/2014 Document Reviewed: 01/11/2014 Elsevier Interactive Patient Education 2016 [employer]  [place] daily with hibiclens

## 2015-08-27 ENCOUNTER — Ambulatory Visit (INDEPENDENT_AMBULATORY_CARE_PROVIDER_SITE_OTHER): Payer: Medicaid Other | Admitting: Family Medicine

## 2015-08-27 ENCOUNTER — Encounter: Payer: Self-pay | Admitting: Family Medicine

## 2015-08-27 ENCOUNTER — Other Ambulatory Visit: Payer: Self-pay | Admitting: Family Medicine

## 2015-08-27 VITALS — BP 128/76 | HR 84 | Temp 98.1°F | Ht 73.0 in | Wt 304.2 lb

## 2015-08-27 DIAGNOSIS — R7301 Impaired fasting glucose: Secondary | ICD-10-CM

## 2015-08-27 DIAGNOSIS — N926 Irregular menstruation, unspecified: Secondary | ICD-10-CM

## 2015-08-27 DIAGNOSIS — R0981 Nasal congestion: Secondary | ICD-10-CM | POA: Diagnosis not present

## 2015-08-27 LAB — POCT GLYCOSYLATED HEMOGLOBIN (HGB A1C): Hemoglobin A1C: 5.1

## 2015-08-27 LAB — POCT URINE PREGNANCY: PREG TEST UR: NEGATIVE

## 2015-08-27 MED ORDER — ALBUTEROL SULFATE HFA 108 (90 BASE) MCG/ACT IN AERS: 2.0000 | INHALATION_SPRAY | Freq: Four times a day (QID) | RESPIRATORY_TRACT | Status: DC | PRN

## 2015-08-27 MED ORDER — FLUTICASONE PROPIONATE 50 MCG/ACT NA SUSP: 2.0000 | Freq: Every day | NASAL | Status: DC

## 2015-08-27 MED ORDER — BENZONATATE 100 MG PO CAPS: 100.0000 mg | ORAL_CAPSULE | Freq: Two times a day (BID) | ORAL | Status: DC | PRN

## 2015-08-27 NOTE — Telephone Encounter (Signed)
Not on med list

## 2015-08-27 NOTE — Progress Notes (Signed)
Patient aware.

## 2015-08-27 NOTE — Telephone Encounter (Signed)
[facility]

## 2015-08-27 NOTE — Progress Notes (Signed)
BP 128/76 mmHg  Pulse 84  Temp(Src) 98.1 F (36.7 C) (Oral)  Ht 6\' 1"  (1.854 m)  Wt 304 lb 3.2 oz (137.984 kg)  BMI 40.14 kg/m2  LMP 07/05/2015 (Approximate)  Breastfeeding? No   Subjective:    Patient ID: Heather Khan, female    DOB: 05/25/1986, 29 y.o.   MRN: [id number]  HPI: Heather Khan is a 29 y.o. female presenting on 08/27/2015 for Sinusitis and Cough   HPI Sinus congestion Patient has been having sinus congestion and sinus headache and nasal congestion and nasal drainage and postnasal drip. This has been worsening over the past 2 days. She denies any fevers or chills. She does admit to having a lot of coughing spells that are specially worse at night and early morning. Her throat is especially worse at night and early morning as well. She has tried to use an oral antihistamine and DayQuil.  Missed period Patient has not had a period since September 10 and is concerned about being pregnant or why she is missing her periods. she said that she used to have irregular periods but that they have been more regular since her last child was born. She denies any vaginal discharge or pelvic pain. She is sexually active with one partner and same partner for a while now.  Elevated fasting glucose Patient's fasting glucose was 115 at her last visit in September. She has not checked any A1c or confirmatory tests since that point.   Relevant past medical, surgical, family and social history reviewed and updated as indicated. Interim medical history since our last visit reviewed. Allergies and medications reviewed and updated.  Review of Systems  Constitutional: Negative for fever and chills.  HENT: Positive for congestion, postnasal drip, rhinorrhea, sinus pressure and sore throat. Negative for ear discharge, ear pain and sneezing.   Eyes: Negative for pain, redness and visual disturbance.  Respiratory: Positive for cough. Negative for chest tightness and shortness of breath.     Cardiovascular: Negative for chest pain and leg swelling.  Genitourinary: Positive for menstrual problem. Negative for dysuria, urgency, frequency, flank pain, decreased urine volume, vaginal bleeding, vaginal discharge, difficulty urinating, vaginal pain and pelvic pain.  Musculoskeletal: Negative for back pain and gait problem.  Skin: Negative for rash.  Neurological: Negative for light-headedness and headaches.  Psychiatric/Behavioral: Negative for behavioral problems and agitation.  All other systems reviewed and are negative.   Per HPI unless specifically indicated above     Medication List       This list is accurate as of: 08/27/15  9:10 AM.  Always use your most recent med list.               albuterol 108 (90 BASE) MCG/ACT inhaler  Commonly known as:  PROVENTIL HFA;VENTOLIN HFA  Inhale 2 puffs into the lungs every 6 (six) hours as needed for wheezing or shortness of breath.     fluticasone 50 MCG/ACT nasal spray  Commonly known as:  FLONASE  Place 2 sprays into both nostrils daily.           Objective:    BP 128/76 mmHg  Pulse 84  Temp(Src) 98.1 F (36.7 C) (Oral)  Ht 6\' 1"  (1.854 m)  Wt 304 lb 3.2 oz (137.984 kg)  BMI 40.14 kg/m2  LMP 07/05/2015 (Approximate)  Breastfeeding? No  Wt Readings from Last 3 Encounters:  08/27/15 304 lb 3.2 oz (137.984 kg)  08/11/15 296 lb 3.2 oz (134.355 kg)  07/11/15 298  lb (135.172 kg)    Physical Exam  Constitutional: She is oriented to person, place, and time. She appears well-developed and well-nourished. No distress.  HENT:  Right Ear: Tympanic membrane, external ear and ear canal normal.  Left Ear: Tympanic membrane, external ear and ear canal normal.  Nose: Mucosal edema and rhinorrhea present. No epistaxis. Right sinus exhibits no maxillary sinus tenderness and no frontal sinus tenderness. Left sinus exhibits no maxillary sinus tenderness and no frontal sinus tenderness.  Mouth/Throat: Uvula is midline and mucous  membranes are normal. Posterior oropharyngeal edema and posterior oropharyngeal erythema present. No oropharyngeal exudate or tonsillar abscesses.  Eyes: Conjunctivae and EOM are normal.  Neck: Neck supple. No thyromegaly present.  Cardiovascular: Normal rate, regular rhythm, normal heart sounds and intact distal pulses.   No murmur heard. Pulmonary/Chest: Effort normal. No respiratory distress. She has wheezes (very minimal end expiratory wheeze).  Abdominal: Soft. Bowel sounds are normal. She exhibits no distension. There is no tenderness. There is no rebound.  Musculoskeletal: Normal range of motion. She exhibits no edema or tenderness.  Lymphadenopathy:    She has no cervical adenopathy.  Neurological: She is alert and oriented to person, place, and time. Coordination normal.  Skin: Skin is warm and dry. No rash noted. She is not diaphoretic.  Psychiatric: She has a normal mood and affect. Her behavior is normal.  Vitals reviewed.   Results for orders placed or performed in visit on 08/27/15  POCT urine pregnancy  Result Value Ref Range   Preg Test, Ur Negative Negative      Assessment & Plan:       Problem List Items Addressed This Visit    None    Visit Diagnoses    Missed period    -  Primary    Urine pregnancy is negative, will monitor because his only been a month and a half. If still not having periods in for 5 months return.    Relevant Orders    POCT urine pregnancy (Completed)    Sinus congestion        We'll give Flonase, and inhaler because of previous history of asthma and a mild wheeze on exam, recommended smoking cessation    Relevant Medications    fluticasone (FLONASE) 50 MCG/ACT nasal spray    albuterol (PROVENTIL HFA;VENTOLIN HFA) 108 (90 BASE) MCG/ACT inhaler    Elevated fasting glucose        We'll check an A1c because of elevated fasting glucose.    Relevant Orders    POCT glycosylated hemoglobin (Hb A1C)        Follow up plan: Return in about 3  months (around 11/27/2015), or if symptoms worsen or fail to improve, for Smoking cessation.  Arville Care, MD [facility] 08/27/2015, 9:10 AM

## 2015-09-26 ENCOUNTER — Ambulatory Visit: Payer: Medicaid Other | Admitting: Pharmacist

## 2015-09-29 ENCOUNTER — Encounter (HOSPITAL_BASED_OUTPATIENT_CLINIC_OR_DEPARTMENT_OTHER): Payer: Self-pay | Admitting: *Deleted

## 2015-09-29 DIAGNOSIS — R1012 Left upper quadrant pain: Secondary | ICD-10-CM | POA: Insufficient documentation

## 2015-09-29 DIAGNOSIS — F17210 Nicotine dependence, cigarettes, uncomplicated: Secondary | ICD-10-CM | POA: Insufficient documentation

## 2015-09-29 LAB — URINALYSIS, ROUTINE W REFLEX MICROSCOPIC
Bilirubin Urine: NEGATIVE
GLUCOSE, UA: NEGATIVE mg/dL
Hgb urine dipstick: NEGATIVE
Ketones, ur: NEGATIVE mg/dL
NITRITE: NEGATIVE
PROTEIN: NEGATIVE mg/dL
Specific Gravity, Urine: 1.014 (ref 1.005–1.030)
pH: 5.5 (ref 5.0–8.0)

## 2015-09-29 LAB — URINE MICROSCOPIC-ADD ON

## 2015-09-29 LAB — PREGNANCY, URINE: Preg Test, Ur: NEGATIVE

## 2015-09-29 NOTE — ED Notes (Signed)
Left upper quadrant pain since yesterday.

## 2015-09-30 ENCOUNTER — Emergency Department (HOSPITAL_BASED_OUTPATIENT_CLINIC_OR_DEPARTMENT_OTHER)
Admission: EM | Admit: 2015-09-30 | Discharge: 2015-09-30 | Discharge status: Against Medical Advice | Payer: Medicaid Other | Attending: Emergency Medicine | Admitting: Emergency Medicine

## 2015-09-30 NOTE — ED Notes (Signed)
Called to room patient and pt not in waiting room.

## 2015-10-10 ENCOUNTER — Encounter: Payer: Self-pay | Admitting: Family Medicine

## 2015-10-10 ENCOUNTER — Ambulatory Visit (INDEPENDENT_AMBULATORY_CARE_PROVIDER_SITE_OTHER): Payer: Medicaid Other | Admitting: Family Medicine

## 2015-10-10 VITALS — BP 109/72 | HR 100 | Temp 97.8°F | Ht 73.0 in | Wt 298.0 lb

## 2015-10-10 DIAGNOSIS — R6889 Other general symptoms and signs: Secondary | ICD-10-CM | POA: Diagnosis not present

## 2015-10-10 DIAGNOSIS — B349 Viral infection, unspecified: Secondary | ICD-10-CM

## 2015-10-10 DIAGNOSIS — K529 Noninfective gastroenteritis and colitis, unspecified: Secondary | ICD-10-CM

## 2015-10-10 DIAGNOSIS — R059 Cough, unspecified: Secondary | ICD-10-CM

## 2015-10-10 DIAGNOSIS — R05 Cough: Secondary | ICD-10-CM | POA: Diagnosis not present

## 2015-10-10 DIAGNOSIS — J029 Acute pharyngitis, unspecified: Secondary | ICD-10-CM

## 2015-10-10 LAB — POCT RAPID STREP A (OFFICE): Rapid Strep A Screen: NEGATIVE

## 2015-10-10 LAB — POCT INFLUENZA A/B
Influenza A, POC: NEGATIVE
Influenza B, POC: NEGATIVE

## 2015-10-10 NOTE — Progress Notes (Signed)
Subjective:    Patient ID: Heather Khan, female    DOB: [birth year], 29 y.o.   MRN: [id number]  HPI Patient here today for cough, congestion and sore throat. Symptoms first started last Thursday. Surely the patient did have more loose bowel movements. She is also had some vomiting. Loose bowel movements seem to taper off.      Patient Active Problem List   Diagnosis Date Noted  . Depression 05/16/2015  . Severe obesity (BMI >= 40) (HCC) 06/10/2014  . Irregular menstrual cycle 06/10/2014   Outpatient Encounter Prescriptions as of 10/10/2015  Medication Sig  . albuterol (PROVENTIL HFA;VENTOLIN HFA) 108 (90 BASE) MCG/ACT inhaler Inhale 2 puffs into the lungs every 6 (six) hours as needed for wheezing or shortness of breath.  . [DISCONTINUED] benzonatate (TESSALON) 100 MG capsule Take 1 capsule (100 mg total) by mouth 2 (two) times daily as needed for cough.  . [DISCONTINUED] DULoxetine (CYMBALTA) 60 MG capsule Take 1 capsule (60 mg total) by mouth daily. With food at suppertime  . [DISCONTINUED] fluticasone (FLONASE) 50 MCG/ACT nasal spray Place 2 sprays into both nostrils daily.   No facility-administered encounter medications on file as of 10/10/2015.      Review of Systems  Constitutional: Negative.  Negative for fever.  HENT: Positive for congestion, postnasal drip and sore throat.   Eyes: Negative.   Respiratory: Positive for cough.   Cardiovascular: Negative.   Gastrointestinal: Negative.   Endocrine: Negative.   Genitourinary: Negative.   Musculoskeletal: Positive for myalgias.  Skin: Negative.   Allergic/Immunologic: Negative.   Neurological: Negative.   Hematological: Negative.   Psychiatric/Behavioral: Negative.        Objective:   Physical Exam  Constitutional: She is oriented to person, place, and time. She appears well-developed and well-nourished. No distress.  HENT:  Head: Normocephalic and atraumatic.  Right Ear: External ear normal.  Left Ear:  External ear normal.  There is nasal congestion and turbinate swelling bilaterally and the throat is red posteriorly  Eyes: Conjunctivae and EOM are normal. Pupils are equal, round, and reactive to light. Right eye exhibits no discharge. Left eye exhibits no discharge. No scleral icterus.  Neck: Normal range of motion. Neck supple. No thyromegaly present.  No anterior cervical adenopathy  Cardiovascular: Normal rate, regular rhythm and normal heart sounds.   No murmur heard. Pulmonary/Chest: Effort normal and breath sounds normal. No respiratory distress. She has no wheezes. She has no rales. She exhibits no tenderness.  Patient has a raspy cough no specific wheezes. No rales.  Abdominal: Soft. Bowel sounds are normal. She exhibits no mass. There is tenderness. There is no rebound and no guarding.  Abdomen is obese. The abdomen is generally tender.  Musculoskeletal: Normal range of motion. She exhibits no edema.  Lymphadenopathy:    She has no cervical adenopathy.  Neurological: She is alert and oriented to person, place, and time.  Skin: Skin is warm and dry. No rash noted.  Psychiatric: She has a normal mood and affect. Her behavior is normal. Judgment and thought content normal.  Nursing note and vitals reviewed.   BP 109/72 mmHg  Pulse 100  Temp(Src) 97.8 F (36.6 C) (Oral)  Ht 6\' 1"  (1.854 m)  Wt 298 lb (135.172 kg)  BMI 39.32 kg/m2  LMP 10/07/2015  Results for orders placed or performed in visit on 10/10/15  POCT Influenza A/B  Result Value Ref Range   Influenza A, POC Negative Negative   Influenza B, POC Negative  Negative  POCT rapid strep A  Result Value Ref Range   Rapid Strep A Screen Negative Negative        Assessment & Plan:  1. Flu-like symptoms -Continue fluids and take Tylenol for aches pains and fever and take Mucinex as needed for cough and congestion - POCT Influenza A/B - CBC with Differential/Platelet  2. Sore throat -Await throat culture  results - POCT rapid strep A - Culture, Group A Strep - CBC with Differential/Platelet  3. Noninfectious gastroenteritis, unspecified -The liquids and gradually increase diet  4. Viral syndrome -Treat symptoms as recommended with diet and take cough medicine as needed - CBC with Differential/Platelet  5. Cough -Take Mucinex twice daily with a large glass of water  Patient Instructions  Take Mucinex for cough and congestion maximum strength 1 twice daily with a large glass of water Drink plenty of fluids and stay well hydrated and use a cool mist humidifier in the room and keep the house as cool as possible Continue to use inhaler regularly  Clear liquids for 24 hours (like 7-Up, ginger ale, Sprite, Jello, frozen pops) Full liquids the second 24-hours (like potato soup, tomato soup, chicken noodle soup) Bland diet the third 24-hours (boiled and baked foods, no fried or greasy foods) Avoid milk, cheese, ice cream and dairy products for 72 hours. Avoid caffeine (cola drinks, coffee, tea, Mountain Dew, Mellow Yellow) Take in small amounts, but frequently. Tylenol and/or Advil as needed for aches pains and fever    Nyra Capes MD

## 2015-10-10 NOTE — Patient Instructions (Addendum)
Take Mucinex for cough and congestion maximum strength 1 twice daily with a large glass of water Drink plenty of fluids and stay well hydrated and use a cool mist humidifier in the room and keep the house as cool as possible Continue to use inhaler regularly  Clear liquids for 24 hours (like 7-Up, ginger ale, Sprite, Jello, frozen pops) Full liquids the second 24-hours (like potato soup, tomato soup, chicken noodle soup) Bland diet the third 24-hours (boiled and baked foods, no fried or greasy foods) Avoid milk, cheese, ice cream and dairy products for 72 hours. Avoid caffeine (cola drinks, coffee, tea, Mountain Dew, Mellow Yellow) Take in small amounts, but frequently. Tylenol and/or Advil as needed for aches pains and fever

## 2015-10-11 LAB — CBC WITH DIFFERENTIAL/PLATELET
BASOS ABS: 0 10*3/uL (ref 0.0–0.2)
BASOS: 0 %
EOS (ABSOLUTE): 0.2 10*3/uL (ref 0.0–0.4)
EOS: 3 %
HEMOGLOBIN: 13.9 g/dL (ref 11.1–15.9)
Hematocrit: 41.9 % (ref 34.0–46.6)
IMMATURE GRANS (ABS): 0 10*3/uL (ref 0.0–0.1)
Immature Granulocytes: 0 %
LYMPHS ABS: 2.5 10*3/uL (ref 0.7–3.1)
LYMPHS: 28 %
MCH: 27.4 pg (ref 26.6–33.0)
MCHC: 33.2 g/dL (ref 31.5–35.7)
MCV: 83 fL (ref 79–97)
MONOCYTES: 8 %
Monocytes Absolute: 0.7 10*3/uL (ref 0.1–0.9)
NEUTROS PCT: 61 %
Neutrophils Absolute: 5.5 10*3/uL (ref 1.4–7.0)
PLATELETS: 359 10*3/uL (ref 150–379)
RBC: 5.08 x10E6/uL (ref 3.77–5.28)
RDW: 14.5 % (ref 12.3–15.4)
WBC: 9 10*3/uL (ref 3.4–10.8)

## 2015-10-12 LAB — CULTURE, GROUP A STREP

## 2016-01-20 ENCOUNTER — Encounter: Payer: Self-pay | Admitting: Family

## 2016-01-20 ENCOUNTER — Ambulatory Visit (INDEPENDENT_AMBULATORY_CARE_PROVIDER_SITE_OTHER): Payer: Medicaid Other | Admitting: Family

## 2016-01-20 ENCOUNTER — Ambulatory Visit: Payer: Medicaid Other | Admitting: Family

## 2016-01-20 VITALS — BP 115/79 | HR 95 | Temp 98.6°F | Ht 73.0 in | Wt 308.0 lb

## 2016-01-20 DIAGNOSIS — A084 Viral intestinal infection, unspecified: Secondary | ICD-10-CM

## 2016-01-20 MED ORDER — ONDANSETRON HCL 4 MG PO TABS: 4.0000 mg | ORAL_TABLET | Freq: Three times a day (TID) | ORAL | Status: DC | PRN

## 2016-01-20 NOTE — Patient Instructions (Signed)

## 2016-01-20 NOTE — Progress Notes (Signed)
   Subjective:    Patient ID: Heather Khan, female    DOB: 1986/06/30, 30 y.o.   MRN: [id number]  Emesis  This is a new problem. The current episode started yesterday. The problem occurs 2 to 4 times per day. The problem has been resolved. Associated symptoms include chills, coughing, diarrhea and a fever. Pertinent negatives include no headaches.  Fever  Associated symptoms include coughing, diarrhea and vomiting. Pertinent negatives include no headaches.  Diarrhea  This is a new problem. The current episode started today. The problem occurs 5 to 10 times per day. The problem has been unchanged. The patient states that diarrhea awakens her from sleep. Associated symptoms include chills, coughing, a fever and vomiting. Pertinent negatives include no bloating or headaches. Nothing aggravates the symptoms. Risk factors include ill contacts. The treatment provided mild relief.      Review of Systems  Constitutional: Positive for fever and chills.  HENT: Negative.   Eyes: Negative.   Respiratory: Positive for cough. Negative for shortness of breath.   Cardiovascular: Negative.  Negative for palpitations.  Gastrointestinal: Positive for vomiting and diarrhea. Negative for bloating.  Endocrine: Negative.   Genitourinary: Negative.   Musculoskeletal: Negative.   Neurological: Negative.  Negative for headaches.  Hematological: Negative.   Psychiatric/Behavioral: Negative.   All other systems reviewed and are negative.      Objective:   Physical Exam  Constitutional: She is oriented to person, place, and time. She appears well-developed and well-nourished. No distress.  HENT:  Head: Normocephalic.  Eyes: Pupils are equal, round, and reactive to light.  Neck: Normal range of motion. Neck supple. No thyromegaly present.  Cardiovascular: Normal rate, regular rhythm, normal heart sounds and intact distal pulses.   No murmur heard. Pulmonary/Chest: Effort normal and breath sounds normal.  No respiratory distress. She has no wheezes.  Abdominal: Soft. She exhibits no distension. There is no tenderness.  Hyperactive BS   Musculoskeletal: Normal range of motion. She exhibits no edema or tenderness.  Neurological: She is alert and oriented to person, place, and time.  Skin: Skin is warm and dry.  Psychiatric: She has a normal mood and affect. Her behavior is normal. Judgment and thought content normal.  Vitals reviewed.   BP 115/79 mmHg  Pulse 95  Temp(Src) 98.6 F (37 C) (Oral)  Ht 6\' 1"  (1.854 m)  Wt 308 lb (139.708 kg)  BMI 40.64 kg/m2       Assessment & Plan:  1. Viral gastroenteritis -Force fluids -Bland diet -Tylenol prn for fever -RTO prn  - ondansetron (ZOFRAN) 4 MG tablet; Take 1 tablet (4 mg total) by mouth every 8 (eight) hours as needed for nausea or vomiting.  Dispense: 20 tablet; Refill: 0  Jannifer Rodneyhristy Majesti Gambrell, FNP

## 2016-01-28 ENCOUNTER — Ambulatory Visit (INDEPENDENT_AMBULATORY_CARE_PROVIDER_SITE_OTHER): Payer: Medicaid Other | Admitting: Adult Health

## 2016-01-28 ENCOUNTER — Encounter: Payer: Self-pay | Admitting: Adult Health

## 2016-01-28 VITALS — BP 120/88 | HR 100 | Ht 73.0 in | Wt 310.5 lb

## 2016-01-28 DIAGNOSIS — Z3202 Encounter for pregnancy test, result negative: Secondary | ICD-10-CM

## 2016-01-28 DIAGNOSIS — N926 Irregular menstruation, unspecified: Secondary | ICD-10-CM | POA: Diagnosis not present

## 2016-01-28 DIAGNOSIS — N912 Amenorrhea, unspecified: Secondary | ICD-10-CM

## 2016-01-28 HISTORY — DX: Amenorrhea, unspecified: N91.2

## 2016-01-28 LAB — POCT URINE PREGNANCY: Preg Test, Ur: NEGATIVE

## 2016-01-28 MED ORDER — MEDROXYPROGESTERONE ACETATE 10 MG PO TABS: 10.0000 mg | ORAL_TABLET | Freq: Every day | ORAL | Status: DC

## 2016-01-28 NOTE — Patient Instructions (Signed)
Take provera for 10 days  Call with periiod Return in 4 weeks for physical

## 2016-01-28 NOTE — Progress Notes (Signed)
Subjective:     Patient ID: Heather Khan, female   DOB: 01/22/1986, 30 y.o.   MRN: [id number]  HPI Heather Khan is a 30 year old white female in complaining of no period since, 09/10/15, had been regular til then, 5 days every month.   Review of Systems Patient denies any headaches, hearing loss, fatigue, blurred vision, shortness of breath, chest pain, abdominal pain, problems with bowel movements, urination, or intercourse. No joint pain or mood swings.No period since 09/10/15 Reviewed past medical,surgical, social and family history. Reviewed medications and allergies.     Objective:   Physical Exam BP 120/88 mmHg  Pulse 100  Ht 6\' 1"  (1.854 m)  Wt 310 lb 8 oz (140.842 kg)  BMI 40.97 kg/m2  LMP 09/10/2015 UPT negative, Skin warm and dry. Neck: mid line trachea, normal thyroid, good ROM, no lymphadenopathy noted. Lungs: clear to ausculation bilaterally. Cardiovascular: regular rate and rhythm.    Assessment:     Amenorrhea Pregnancy test negative     Plan:     Rx provera 10 mg #10 take 1 daily for 10 days Call with me period or nor Return in 4 weeks for physical and pap

## 2016-02-02 ENCOUNTER — Ambulatory Visit (INDEPENDENT_AMBULATORY_CARE_PROVIDER_SITE_OTHER): Payer: Medicaid Other | Admitting: Family Medicine

## 2016-02-02 ENCOUNTER — Encounter: Payer: Self-pay | Admitting: Family Medicine

## 2016-02-02 VITALS — BP 135/98 | HR 103 | Temp 98.4°F | Ht 73.0 in | Wt 307.0 lb

## 2016-02-02 DIAGNOSIS — J40 Bronchitis, not specified as acute or chronic: Secondary | ICD-10-CM

## 2016-02-02 DIAGNOSIS — J329 Chronic sinusitis, unspecified: Secondary | ICD-10-CM

## 2016-02-02 MED ORDER — PSEUDOEPHEDRINE-GUAIFENESIN ER 60-600 MG PO TB12: 1.0000 | ORAL_TABLET | Freq: Two times a day (BID) | ORAL | Status: DC

## 2016-02-02 MED ORDER — GUAIFENESIN-CODEINE 100-10 MG/5ML PO SYRP: 5.0000 mL | ORAL_SOLUTION | ORAL | Status: DC | PRN

## 2016-02-02 MED ORDER — LEVOFLOXACIN 500 MG PO TABS: 500.0000 mg | ORAL_TABLET | Freq: Every day | ORAL | Status: DC

## 2016-02-02 NOTE — Progress Notes (Signed)
Subjective:  Patient ID: Heather Khan, female    DOB: April 23, 1986  Age: 30 y.o. MRN: [id number]  CC: URI   HPI Heather Khan presents for Patient presents with upper respiratory congestion. Rhinorrhea that is frequently purulent. There is moderate sore throat. Patient reports coughing frequently as well.Green -colored/purulent sputum noted. There was fever with chills and sweats at onset. The patient reorts being short of breath. Onset was 1 week ago. Gradually worsening.Continues to smoke.   History Heather Khan has a past medical history of Anxiety; PTSD (post-traumatic stress disorder); Bipolar 1 disorder (HCC); OCD (obsessive compulsive disorder); Plantar fasciitis, bilateral; Miscarriage, threatened, early pregnancy (02/03/2015); ADD (attention deficit disorder); Anger; and Amenorrhea (01/28/2016).   She has past surgical history that includes Appendectomy; Finger surgery; and Multiple extractions with alveoloplasty (N/A, 07/02/2013).   Her family history includes Cancer in her maternal grandmother; Depression in her father; Diabetes in her father and maternal grandfather; Healthy in her sister, sister, and son; Heart attack in her paternal grandfather; Hypertension in her mother; Kidney disease in her maternal grandfather; Other in her father; Stroke in her paternal grandfather.She reports that she has been smoking Cigarettes.  She has a 24 pack-year smoking history. She has never used smokeless tobacco. She reports that she drinks alcohol. She reports that she does not use illicit drugs.    ROS Review of Systems  Constitutional: Negative for fever, chills, activity change and appetite change.  HENT: Positive for congestion, postnasal drip, rhinorrhea and sinus pressure. Negative for ear discharge, ear pain, hearing loss, nosebleeds, sneezing and trouble swallowing.   Respiratory: Negative for chest tightness and shortness of breath.   Cardiovascular: Negative for chest pain and  palpitations.  Skin: Negative for rash.    Objective:  BP 135/98 mmHg  Pulse 103  Temp(Src) 98.4 F (36.9 C) (Oral)  Ht  (1.854 m)  Wt 307 lb (139.254 kg)  BMI 40.51 kg/m2  SpO2 94%  LMP 09/10/2015  BP Readings from Last 3 Encounters:  02/02/16 135/98  01/28/16 120/88  01/20/16 115/79    Wt Readings from Last 3 Encounters:  02/02/16 307 lb (139.254 kg)  01/28/16 310 lb 8 oz (140.842 kg)  01/20/16 308 lb (139.708 kg)     Physical Exam  Constitutional: She appears well-developed and well-nourished.  HENT:  Head: Normocephalic and atraumatic.  Right Ear: Tympanic membrane and external ear normal. No decreased hearing is noted.  Left Ear: Tympanic membrane and external ear normal. No decreased hearing is noted.  Nose: Mucosal edema present. Right sinus exhibits no frontal sinus tenderness. Left sinus exhibits no frontal sinus tenderness.  Mouth/Throat: No oropharyngeal exudate or posterior oropharyngeal erythema.  Neck: No Brudzinski's sign noted.  Pulmonary/Chest: Breath sounds normal. No respiratory distress.  Lymphadenopathy:       Head (right side): No preauricular adenopathy present.       Head (left side): No preauricular adenopathy present.       Right cervical: No superficial cervical adenopathy present.      Left cervical: No superficial cervical adenopathy present.     Lab Results  Component Value Date   WBC 9.0 10/10/2015   HGB 13.1 06/28/2015   HCT 41.9 10/10/2015   PLT 359 10/10/2015   GLUCOSE 115* 06/28/2015   CHOL 135 11/01/2014   TRIG 142 11/01/2014   HDL 32* 11/01/2014   LDLCALC 75 11/01/2014   ALT 43 06/28/2015   AST 30 06/28/2015   NA 141 06/28/2015   K 3.5 06/28/2015  CL 107 06/28/2015   CREATININE 0.88 06/28/2015   BUN 10 06/28/2015   CO2 26 06/28/2015   TSH 1.830 11/01/2014   HGBA1C 5.1 08/27/2015    No results found.  Assessment & Plan:   Heather Khan was seen today for uri.  Diagnoses and all orders for this  visit:  Sinobronchitis  Other orders -     levofloxacin (LEVAQUIN) 500 MG tablet; Take 1 tablet (500 mg total) by mouth daily. For 10 days -     pseudoephedrine-guaifenesin (MUCINEX D) 60-600 MG 12 hr tablet; Take 1 tablet by mouth every 12 (twelve) hours. As needed for congestion -     guaiFENesin-codeine (CHERATUSSIN AC) 100-10 MG/5ML syrup; Take 5 mLs by mouth every 4 (four) hours as needed for cough.      I am having Heather Khan start on levofloxacin, pseudoephedrine-guaifenesin, and guaiFENesin-codeine. I am also having her maintain her albuterol, ondansetron, and medroxyPROGESTERone.  Meds ordered this encounter  Medications  . levofloxacin (LEVAQUIN) 500 MG tablet    Sig: Take 1 tablet (500 mg total) by mouth daily. For 10 days    Dispense:  10 tablet    Refill:  0  . pseudoephedrine-guaifenesin (MUCINEX D) 60-600 MG 12 hr tablet    Sig: Take 1 tablet by mouth every 12 (twelve) hours. As needed for congestion    Dispense:  20 tablet    Refill:  0  . guaiFENesin-codeine (CHERATUSSIN AC) 100-10 MG/5ML syrup    Sig: Take 5 mLs by mouth every 4 (four) hours as needed for cough.    Dispense:  180 mL    Refill:  0     Follow-up: Return if symptoms worsen or fail to improve.  Mechele ClaudeWarren Delainey Winstanley, M.D.

## 2016-02-05 ENCOUNTER — Ambulatory Visit (INDEPENDENT_AMBULATORY_CARE_PROVIDER_SITE_OTHER): Payer: Medicaid Other | Admitting: Family Medicine

## 2016-02-05 ENCOUNTER — Encounter: Payer: Self-pay | Admitting: Family Medicine

## 2016-02-05 VITALS — BP 118/68 | HR 72 | Temp 98.1°F | Ht 73.0 in | Wt 308.6 lb

## 2016-02-05 DIAGNOSIS — S161XXA Strain of muscle, fascia and tendon at neck level, initial encounter: Secondary | ICD-10-CM

## 2016-02-05 MED ORDER — CYCLOBENZAPRINE HCL 10 MG PO TABS: 10.0000 mg | ORAL_TABLET | Freq: Three times a day (TID) | ORAL | Status: DC | PRN

## 2016-02-05 NOTE — Progress Notes (Signed)
BP 118/68 mmHg  Pulse 72  Temp(Src) 98.1 F (36.7 C) (Oral)  Ht 6\' 1"  (1.854 m)  Wt 308 lb 9.6 oz (139.98 kg)  BMI 40.72 kg/m2  LMP 09/10/2015   Subjective:    Patient ID: Heather ArrowAmanda C Khan, female    DOB: 06-Jul-1986, 30 y.o.   MRN: [id number]  HPI: Heather Khan is a 30 y.o. female presenting on 02/05/2016 for Knot on back   HPI Knot on her back Patient has a knot on her upper back near her neck that she says is been there off and on since she was 30 years old. She feels like a tightness and then sometimes she'll have pain that shoots up either side of her neck associated with it. She denies any fevers or chills or overlying skin changes. She denies any numbness or weakness in either arm or leg. She has been using naproxen off and on for it and it helps some.  Relevant past medical, surgical, family and social history reviewed and updated as indicated. Interim medical history since our last visit reviewed. Allergies and medications reviewed and updated.  Review of Systems  Constitutional: Negative for fever and chills.  HENT: Negative for congestion, ear discharge and ear pain.   Eyes: Negative for redness and visual disturbance.  Respiratory: Negative for chest tightness and shortness of breath.   Cardiovascular: Negative for chest pain and leg swelling.  Genitourinary: Negative for dysuria and difficulty urinating.  Musculoskeletal: Positive for myalgias, back pain and neck pain. Negative for joint swelling, arthralgias, gait problem and neck stiffness.  Skin: Negative for color change and rash.  Neurological: Negative for light-headedness and headaches.  Psychiatric/Behavioral: Negative for behavioral problems and agitation.  All other systems reviewed and are negative.   Per HPI unless specifically indicated above     Medication List       This list is accurate as of: 02/05/16  3:57 PM.  Always use your most recent med list.               albuterol 108 (90  Base) MCG/ACT inhaler  Commonly known as:  PROVENTIL HFA;VENTOLIN HFA  Inhale 2 puffs into the lungs every 6 (six) hours as needed for wheezing or shortness of breath.     cyclobenzaprine 10 MG tablet  Commonly known as:  FLEXERIL  Take 1 tablet (10 mg total) by mouth 3 (three) times daily as needed for muscle spasms.     divalproex 500 MG DR tablet  Commonly known as:  DEPAKOTE  Take 500 mg by mouth 3 (three) times daily.     guaiFENesin-codeine 100-10 MG/5ML syrup  Commonly known as:  CHERATUSSIN AC  Take 5 mLs by mouth every 4 (four) hours as needed for cough.     levofloxacin 500 MG tablet  Commonly known as:  LEVAQUIN  Take 1 tablet (500 mg total) by mouth daily. For 10 days     medroxyPROGESTERone 10 MG tablet  Commonly known as:  PROVERA  Take 1 tablet (10 mg total) by mouth daily.     ondansetron 4 MG tablet  Commonly known as:  ZOFRAN  Take 1 tablet (4 mg total) by mouth every 8 (eight) hours as needed for nausea or vomiting.           Objective:    BP 118/68 mmHg  Pulse 72  Temp(Src) 98.1 F (36.7 C) (Oral)  Ht 6\' 1"  (1.854 m)  Wt 308 lb 9.6 oz (139.98 kg)  BMI 40.72 kg/m2  LMP 09/10/2015  Wt Readings from Last 3 Encounters:  02/05/16 308 lb 9.6 oz (139.98 kg)  02/02/16 307 lb (139.254 kg)  01/28/16 310 lb 8 oz (140.842 kg)    Physical Exam  Constitutional: She is oriented to person, place, and time. She appears well-developed and well-nourished. No distress.  Eyes: Conjunctivae and EOM are normal. Pupils are equal, round, and reactive to light.  Cardiovascular: Normal rate, regular rhythm, normal heart sounds and intact distal pulses.   No murmur heard. Pulmonary/Chest: Effort normal and breath sounds normal. No respiratory distress. She has no wheezes.  Musculoskeletal: Normal range of motion. She exhibits no edema.       Cervical back: She exhibits tenderness (No palpable mass but patient does have muscle spasms in the paraspinal region near her  T1/C7 region.) and spasm. She exhibits normal range of motion, no swelling, no edema, no deformity and no laceration.  Neurological: She is alert and oriented to person, place, and time. Coordination normal.  Skin: Skin is warm and dry. No rash noted. She is not diaphoretic.  Psychiatric: She has a normal mood and affect. Her behavior is normal.  Nursing note and vitals reviewed.   Results for orders placed or performed in visit on 01/28/16  POCT urine pregnancy  Result Value Ref Range   Preg Test, Ur Negative Negative      Assessment & Plan:       Problem List Items Addressed This Visit    None    Visit Diagnoses    Neck strain, initial encounter    -  Primary    Muscular neck strain, Hurts with prolonged standing. Recommended weight loss and physical therapy and muscle relaxer    Relevant Orders    Ambulatory referral to Physical Therapy        Follow up plan: Return if symptoms worsen or fail to improve.  Counseling provided for all of the vaccine components Orders Placed This Encounter  Procedures  . Ambulatory referral to Physical Therapy    Arville Care, MD [facility] 02/05/2016, 3:57 PM

## 2016-02-10 ENCOUNTER — Telehealth: Payer: Self-pay | Admitting: Family Medicine

## 2016-02-10 NOTE — Telephone Encounter (Signed)
Patient called stating that Medicaid only pays for first initial visit and no visits after and she can not afford any further visits.

## 2016-02-11 NOTE — Telephone Encounter (Signed)
Note that initial visit and learn as many of the exercises that can help and then do those exercises at least 4 times a week at home every day. Or at least do the ones that we can do at home. Unfortunately sometimes that is the best we can do but you can learn a lot from them on what you can do during that initial visit. Just make sure you tell them that the initial visit that this is going to be her only 1 so they can teach some things

## 2016-02-14 NOTE — Telephone Encounter (Signed)
Patient aware.

## 2016-02-25 ENCOUNTER — Other Ambulatory Visit: Payer: Medicaid Other | Admitting: Adult Health

## 2016-07-13 DIAGNOSIS — L732 Hidradenitis suppurativa: Secondary | ICD-10-CM | POA: Insufficient documentation

## 2016-07-13 DIAGNOSIS — F17200 Nicotine dependence, unspecified, uncomplicated: Secondary | ICD-10-CM | POA: Insufficient documentation

## 2016-07-13 DIAGNOSIS — M542 Cervicalgia: Secondary | ICD-10-CM | POA: Insufficient documentation

## 2016-07-13 DIAGNOSIS — M722 Plantar fascial fibromatosis: Secondary | ICD-10-CM | POA: Insufficient documentation

## 2016-07-25 IMAGING — CR DG LUMBAR SPINE COMPLETE 4+V
5 series · 5 of 5 positions shown · non-contrast
Comparison: 08/01/2009

CLINICAL DATA: Pt fell down steps yesterday, pain in lower back,
non-radiating pain, no previous lumbar surgeries

EXAM:
LUMBAR SPINE - COMPLETE 4+ VIEW

[t l-spine a.p.]
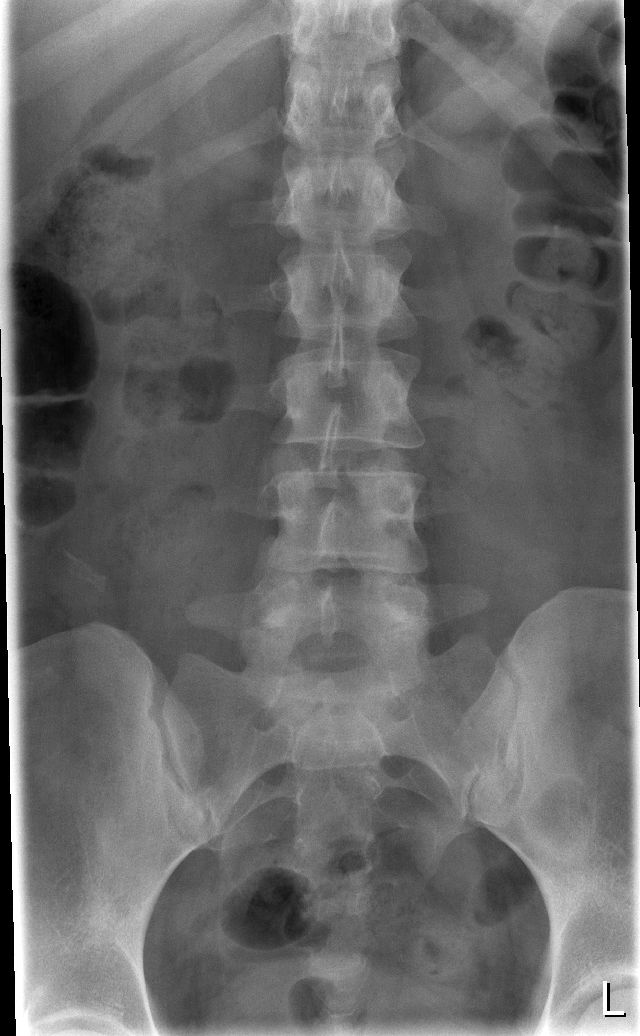

[t l-spine oblique exposure (1 of 2)]
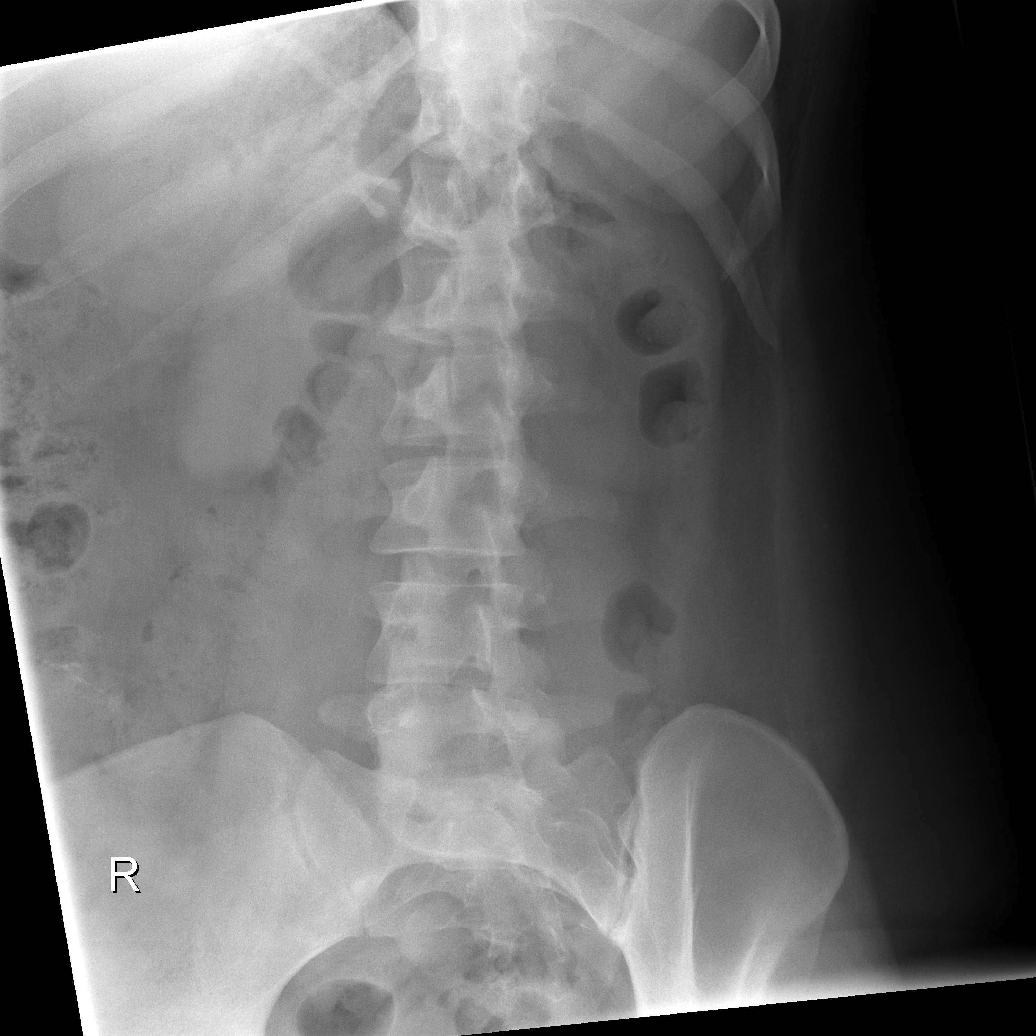

[t l-spine oblique exposure (2 of 2)]
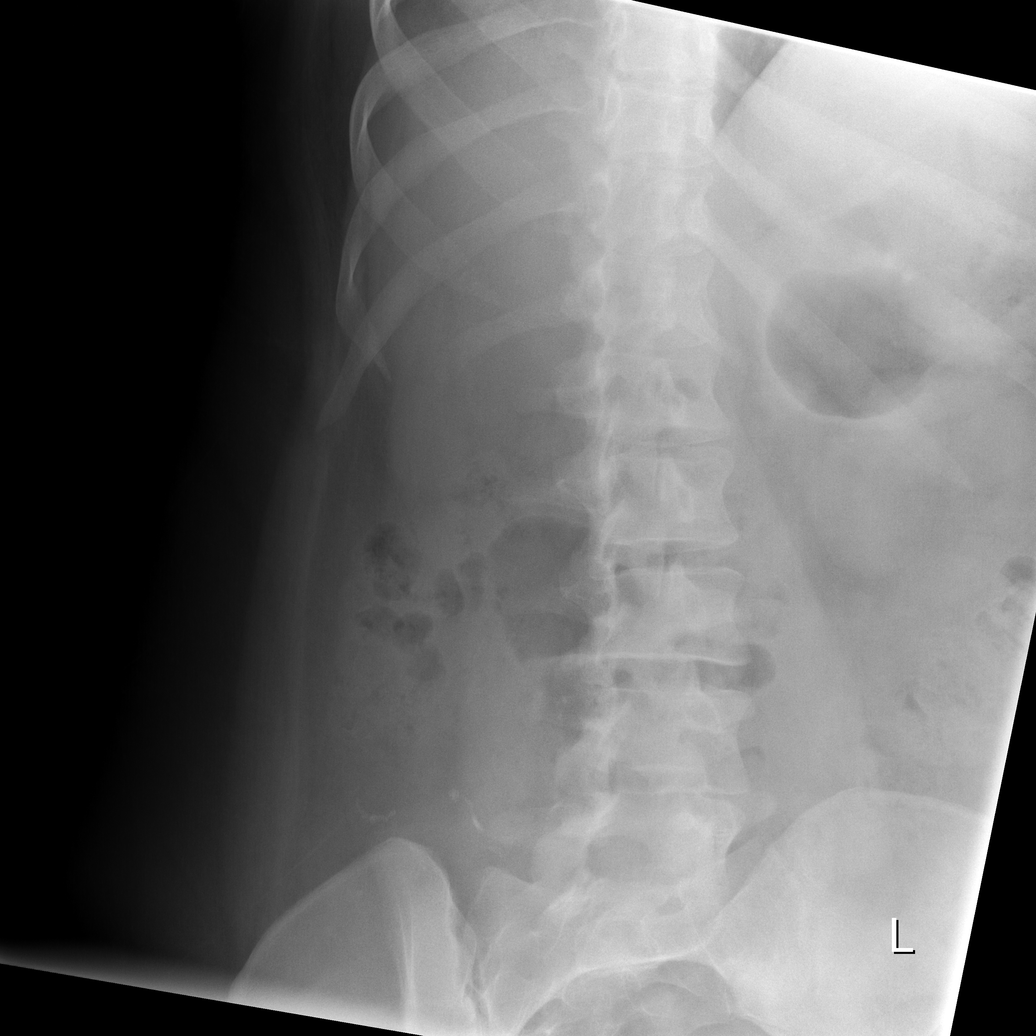

[t l-spine lat *]
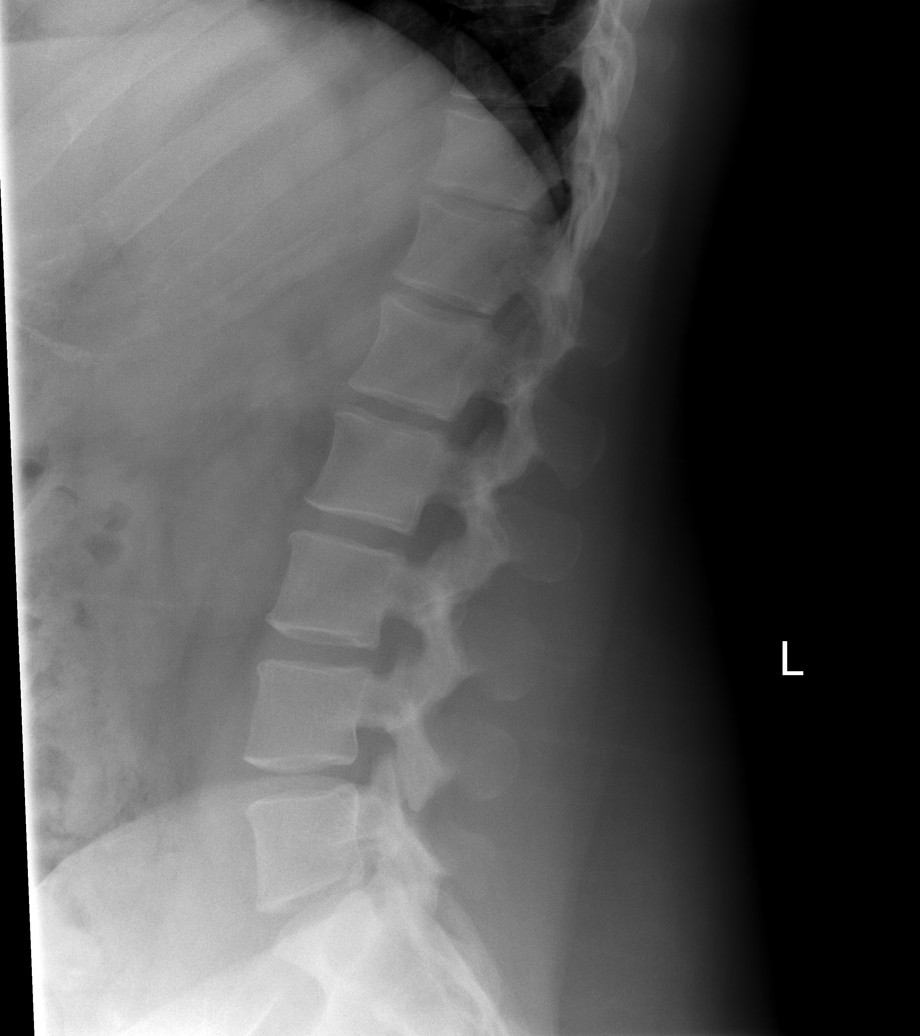

[t l-spine l5-s1 spot *]
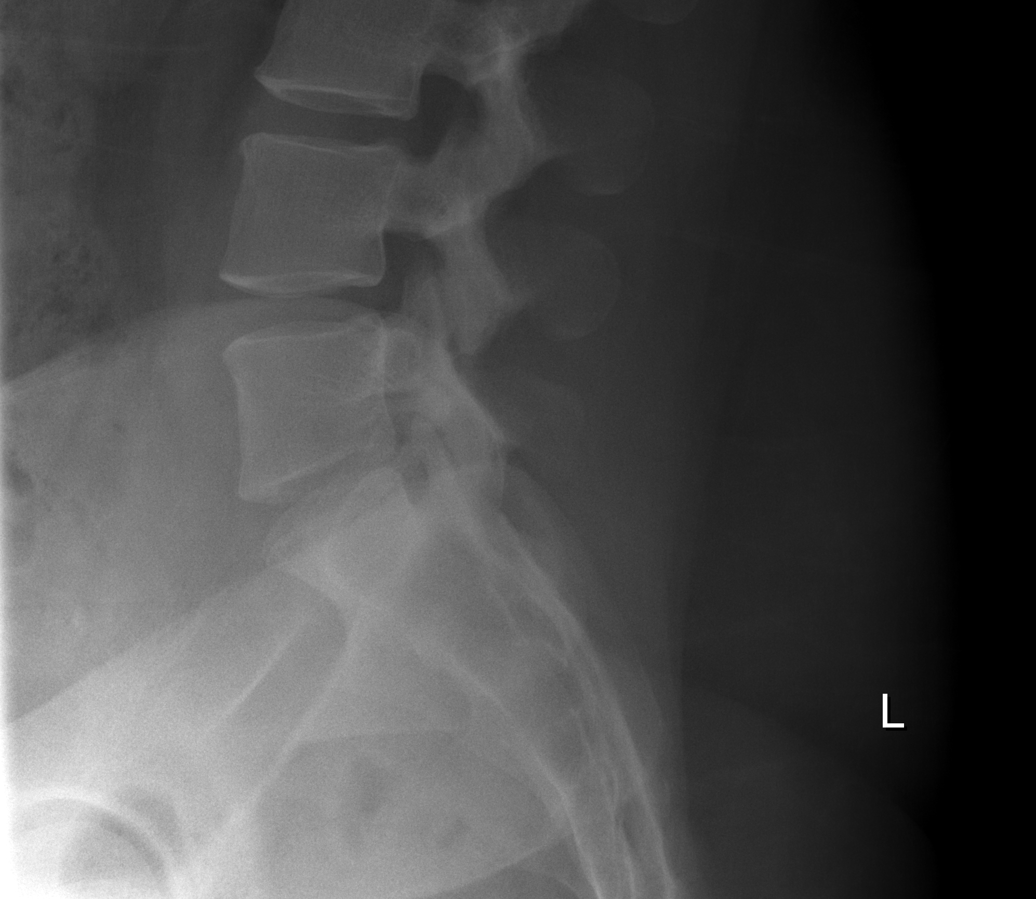

[5 of 5 positions shown; findings below may reference images not displayed]

FINDINGS: There is no evidence of lumbar spine fracture. Alignment is normal.
Intervertebral disc spaces are maintained.
IMPRESSION: Negative.

## 2017-01-14 DIAGNOSIS — M7731 Calcaneal spur, right foot: Secondary | ICD-10-CM | POA: Insufficient documentation

## 2017-01-14 DIAGNOSIS — A084 Viral intestinal infection, unspecified: Secondary | ICD-10-CM | POA: Insufficient documentation

## 2017-01-17 DIAGNOSIS — J029 Acute pharyngitis, unspecified: Secondary | ICD-10-CM | POA: Insufficient documentation

## 2017-01-17 DIAGNOSIS — J020 Streptococcal pharyngitis: Secondary | ICD-10-CM | POA: Insufficient documentation

## 2017-02-04 ENCOUNTER — Other Ambulatory Visit (HOSPITAL_COMMUNITY): Payer: Self-pay | Admitting: Physician Assistant

## 2017-02-10 ENCOUNTER — Other Ambulatory Visit (HOSPITAL_COMMUNITY): Payer: Self-pay | Admitting: Physician Assistant

## 2017-02-10 DIAGNOSIS — N644 Mastodynia: Secondary | ICD-10-CM

## 2017-03-01 ENCOUNTER — Ambulatory Visit (HOSPITAL_COMMUNITY): Payer: Medicaid Other

## 2017-03-01 ENCOUNTER — Other Ambulatory Visit (HOSPITAL_COMMUNITY): Payer: Medicaid Other

## 2017-03-01 ENCOUNTER — Ambulatory Visit (HOSPITAL_COMMUNITY)
Admission: RE | Admit: 2017-03-01 | Discharge: 2017-03-01 | Disposition: A | Discharge status: Home / Self Care | Payer: Medicaid Other | Source: Ambulatory Visit | Attending: Physician Assistant | Admitting: Physician Assistant

## 2017-03-01 DIAGNOSIS — N644 Mastodynia: Secondary | ICD-10-CM | POA: Insufficient documentation

## 2017-06-02 ENCOUNTER — Emergency Department (HOSPITAL_BASED_OUTPATIENT_CLINIC_OR_DEPARTMENT_OTHER)
Admission: EM | Admit: 2017-06-02 | Discharge: 2017-06-03 | Disposition: A | Discharge status: Home / Self Care | Payer: Medicaid Other | Attending: Emergency Medicine | Admitting: Emergency Medicine

## 2017-06-02 ENCOUNTER — Encounter (HOSPITAL_BASED_OUTPATIENT_CLINIC_OR_DEPARTMENT_OTHER): Payer: Self-pay

## 2017-06-02 DIAGNOSIS — L03314 Cellulitis of groin: Secondary | ICD-10-CM | POA: Diagnosis not present

## 2017-06-02 DIAGNOSIS — L0390 Cellulitis, unspecified: Secondary | ICD-10-CM

## 2017-06-02 DIAGNOSIS — F17210 Nicotine dependence, cigarettes, uncomplicated: Secondary | ICD-10-CM | POA: Insufficient documentation

## 2017-06-02 DIAGNOSIS — L02214 Cutaneous abscess of groin: Secondary | ICD-10-CM | POA: Diagnosis present

## 2017-06-02 DIAGNOSIS — Z79899 Other long term (current) drug therapy: Secondary | ICD-10-CM | POA: Insufficient documentation

## 2017-06-02 MED ORDER — MUPIROCIN CALCIUM 2 % EX CREA: 1.0000 "application " | TOPICAL_CREAM | Freq: Two times a day (BID) | CUTANEOUS | 0 refills | Status: DC

## 2017-06-02 MED ORDER — DOXYCYCLINE HYCLATE 100 MG PO TABS
100.0000 mg | ORAL_TABLET | Freq: Once | ORAL | Status: AC
  Administered 2017-06-03: 100 mg via ORAL
  Filled 2017-06-02: qty 1

## 2017-06-02 MED ORDER — DOXYCYCLINE HYCLATE 100 MG PO CAPS: 100.0000 mg | ORAL_CAPSULE | Freq: Two times a day (BID) | ORAL | 0 refills | Status: DC

## 2017-06-02 MED ORDER — NAPROXEN 250 MG PO TABS
500.0000 mg | ORAL_TABLET | Freq: Once | ORAL | Status: AC
  Administered 2017-06-03: 500 mg via ORAL
  Filled 2017-06-02: qty 2

## 2017-06-02 MED ORDER — NAPROXEN 375 MG PO TABS: 375.0000 mg | ORAL_TABLET | Freq: Two times a day (BID) | ORAL | 0 refills | Status: DC

## 2017-06-02 NOTE — ED Triage Notes (Signed)
Abscess to left groin since this morning, no fevers

## 2017-06-02 NOTE — ED Provider Notes (Signed)
[facility]   CSN: [id number] Arrival date & time: 06/02/17  2341  By signing my name below, I, Diona Browner, attest that this documentation has been prepared under the direction and in the presence of Belia Febo, MD. Electronically Signed: Diona Browner, ED Scribe. 06/02/17. 11:54 PM.  History   Chief Complaint Chief Complaint  Patient presents with  . Abscess    HPI Heather Khan is a 31 y.o. female who presents to the Emergency Department complaining of an abscess to her left groin since this morning. She was seen at [facility] in the past for similar sx. Associated sx include diarrhea. She has similar sx in the past and notes they turn black and blue. Pt has used witch hazel with no relief. Warm compresses and warm water soaks alleviate her discomfort. She took 2 500 mg of tylenol around 10 pm with mild relief. Tetanus UTD. No FHx of abscesses. Pt denies fever, drainage, vomiting, or any other complaints at this time.   The history is provided by the patient. No language interpreter was used.  Abscess  Location:  Pelvis Pelvic abscess location:  Groin Size:  1 cm Abscess quality: painful and redness   Abscess quality: not draining, no fluctuance and no warmth   Red streaking: no   Progression:  Unchanged Pain details:    Quality:  Dull   Severity:  Moderate   Timing:  Constant   Progression:  Unchanged Chronicity:  Recurrent Relieved by:  Warm compresses and warm water soaks Worsened by:  Nothing Associated symptoms: no vomiting   Risk factors: no family hx of MRSA and no hx of MRSA     Past Medical History:  Diagnosis Date  . ADD (attention deficit disorder)   . Amenorrhea 01/28/2016  . Anger   . Anxiety    Bipolar--takes no meds  . Bipolar 1 disorder (HCC)   . Miscarriage, threatened, early pregnancy 02/03/2015  . OCD (obsessive compulsive disorder)   . Plantar fasciitis, bilateral   . PTSD (post-traumatic stress disorder)      Patient Active Problem List   Diagnosis Date Noted  . Amenorrhea 01/28/2016  . Depression 05/16/2015  . Severe obesity (BMI >= 40) (HCC) 06/10/2014  . Irregular menstrual cycle 06/10/2014    Past Surgical History:  Procedure Laterality Date  . APPENDECTOMY    . FINGER SURGERY     right pinkie finger  has  2 pins in it  . MULTIPLE EXTRACTIONS WITH ALVEOLOPLASTY N/A 07/02/2013   Procedure: MULTIPLE EXTRACION 3, 4, 5, 8, 9, 10, 11, 12, 14, 18, 19, 29, 30 WITH ALVEOLOPLASTY;  Surgeon: Georgia Lopes, DDS;  Location: [facility] OR;  Service: Oral Surgery;  Laterality: N/A;    OB History    Gravida Para Term Preterm AB Living   3 1     1      SAB TAB Ectopic Multiple Live Births   1               Home Medications    Prior to Admission medications   Medication Sig Start Date End Date Taking? Authorizing Provider  albuterol (PROVENTIL HFA;VENTOLIN HFA) 108 (90 BASE) MCG/ACT inhaler Inhale 2 puffs into the lungs every 6 (six) hours as needed for wheezing or shortness of breath. 08/27/15   Dettinger, Elige Radon, MD  cyclobenzaprine (FLEXERIL) 10 MG tablet Take 1 tablet (10 mg total) by mouth 3 (three) times daily as needed for muscle spasms. 02/05/16   Dettinger, Ivin Booty  A, MD  divalproex (DEPAKOTE) 500 MG DR tablet Take 500 mg by mouth 3 (three) times daily.    [provider]  guaiFENesin-codeine (CHERATUSSIN AC) 100-10 MG/5ML syrup Take 5 mLs by mouth every 4 (four) hours as needed for cough. 02/02/16   Mechele Claude, MD  levofloxacin (LEVAQUIN) 500 MG tablet Take 1 tablet (500 mg total) by mouth daily. For 10 days 02/02/16   Mechele Claude, MD  medroxyPROGESTERone (PROVERA) 10 MG tablet Take 1 tablet (10 mg total) by mouth daily. 01/28/16   Adline Potter, NP  ondansetron (ZOFRAN) 4 MG tablet Take 1 tablet (4 mg total) by mouth every 8 (eight) hours as needed for nausea or vomiting. 01/20/16   Junie Spencer, FNP    Family History Family History  Problem Relation Age of Onset  .  Hypertension Mother   . Diabetes Father   . Other Father        had throat surgery  . Depression Father   . Healthy Sister   . Healthy Son   . Healthy Sister   . Kidney disease Maternal Grandfather   . Diabetes Maternal Grandfather   . Cancer Maternal Grandmother   . Stroke Paternal Grandfather   . Heart attack Paternal Grandfather     Social History Social History  Substance Use Topics  . Smoking status: Current Every Day Smoker    Packs/day: 2.00    Years: 12.00    Types: Cigarettes  . Smokeless tobacco: Never Used  . Alcohol use Yes     Comment: rarely     Allergies   Erythromycin; Penicillins; Iohexol; and Zoloft [sertraline hcl]   Review of Systems Review of Systems  Eyes: Negative for photophobia.  Gastrointestinal: Negative for vomiting.  Skin: Positive for color change. Negative for wound.  All other systems reviewed and are negative.    Physical Exam Updated Vital Signs BP 106/68 (BP Location: Left Arm)   Pulse 92   Temp 98.2 F (36.8 C) (Oral)   Resp 18   Ht 6\' 1"  (1.854 m)   Wt 277 lb (125.6 kg)   LMP 05/09/2017   SpO2 100%   BMI 36.55 kg/m   Physical Exam  Constitutional: She appears well-developed and well-nourished.  HENT:  Head: Normocephalic.  Mouth/Throat: Oropharynx is clear and moist. No oropharyngeal exudate.  Eyes: Pupils are equal, round, and reactive to light. Conjunctivae and EOM are normal. Right eye exhibits no discharge. Left eye exhibits no discharge. No scleral icterus.  Neck: Normal range of motion. Neck supple. No JVD present. No tracheal deviation present.  Trachea is midline. No stridor or carotid bruits.   Cardiovascular: Normal rate, regular rhythm, normal heart sounds and intact distal pulses.   No murmur heard. Pulmonary/Chest: Effort normal and breath sounds normal. No stridor. No respiratory distress. She has no wheezes. She has no rales.  Lungs CTA bilaterally.  Abdominal: Soft. Bowel sounds are normal. She  exhibits no distension. There is no tenderness. There is no rebound and no guarding.  Genitourinary:  Genitourinary Comments: No ulceration. 1 cm area of mild erythema on the mons pubis without warmth or fluctuance. No crepitance no abscess at this time.  Ingrown hair under the skin  Musculoskeletal: Normal range of motion. She exhibits no edema or tenderness.  All compartments are soft. No palpable cords.   Lymphadenopathy:    She has no cervical adenopathy.  Neurological: She is alert. She has normal reflexes. She displays normal reflexes. She exhibits normal muscle  tone.  Skin: Skin is warm and dry. Capillary refill takes less than 2 seconds.  Psychiatric: She has a normal mood and affect. Her behavior is normal.  Nursing note and vitals reviewed.    ED Treatments / Results   Vitals:   06/02/17 2346  BP: 106/68  Pulse: 92  Resp: 18  Temp: 98.2 F (36.8 C)  SpO2: 100%    DIAGNOSTIC STUDIES: Oxygen Saturation is 100% on RA, normal by my interpretation.   COORDINATION OF CARE: 11:54 PM-Discussed next steps with pt which includes taking doxycycline and using a topical ointment. Pt verbalized understanding and is agreeable with the plan.    Radiology No results found.  Procedures Procedures (including critical care time)  Medications Ordered in ED  Medications  doxycycline (VIBRA-TABS) tablet 100 mg (100 mg Oral Given 06/03/17 0002)  naproxen (NAPROSYN) tablet 500 mg (500 mg Oral Given 06/03/17 0001)       Final Clinical Impressions(s) / ED Diagnoses  Cellulitis: starting doxycycline x 7 days and mupiricin topically.  The patient is very well appearing and has been observed in the ED.  Strict return precautions given for groin swelling, fevers > 101 for more than a day, drooling, swelling of the mouth or throat, vomiting, weakness, inability to tolerate oral liquids or foods, shortness of breath, changes in vision or thinking, chest pain, dyspnea on exertion, weakness  or numbness or any concerns. No signs of systemic illness or infection. The patient is nontoxic-appearing on exam and vital signs are within normal limits. recheck with your family doctor in 48 hours      I have reviewed the triage vital signs and the nursing notes. Pertinent labs &imaging results that were available during my care of the patient were reviewed by me and considered in my medical decision making (see chart for details).  After history, exam, and medical workup I feel the patient has been appropriately medically screened and is safe for discharge home. Pertinent diagnoses were discussed with the patient. Patient was given return precautions.  I personally performed the services described in this documentation, which was scribed in my presence. The recorded information has been reviewed and is accurate.      Clova Morlock, MD 06/03/17 [phone]

## 2017-06-02 NOTE — ED Notes (Signed)
Pt took Ibuprofen 200 mg at 10 pm with no relief.  Pt noticed it tonight.

## 2017-06-03 ENCOUNTER — Encounter (HOSPITAL_BASED_OUTPATIENT_CLINIC_OR_DEPARTMENT_OTHER): Payer: Self-pay | Admitting: Emergency Medicine

## 2017-07-30 ENCOUNTER — Encounter (HOSPITAL_BASED_OUTPATIENT_CLINIC_OR_DEPARTMENT_OTHER): Payer: Self-pay | Admitting: Emergency Medicine

## 2017-07-30 ENCOUNTER — Emergency Department (HOSPITAL_BASED_OUTPATIENT_CLINIC_OR_DEPARTMENT_OTHER)
Admission: EM | Admit: 2017-07-30 | Discharge: 2017-07-30 | Disposition: A | Discharge status: Home / Self Care | Payer: Medicaid Other | Attending: Emergency Medicine | Admitting: Emergency Medicine

## 2017-07-30 DIAGNOSIS — F17210 Nicotine dependence, cigarettes, uncomplicated: Secondary | ICD-10-CM | POA: Diagnosis not present

## 2017-07-30 DIAGNOSIS — L600 Ingrowing nail: Secondary | ICD-10-CM

## 2017-07-30 DIAGNOSIS — Z79899 Other long term (current) drug therapy: Secondary | ICD-10-CM | POA: Diagnosis not present

## 2017-07-30 DIAGNOSIS — M79671 Pain in right foot: Secondary | ICD-10-CM | POA: Diagnosis present

## 2017-07-30 MED ORDER — LIDOCAINE HCL (PF) 1 % IJ SOLN: 10.0000 mL | Freq: Once | INTRAMUSCULAR | Status: DC

## 2017-07-30 MED ORDER — LIDOCAINE HCL 1 % IJ SOLN
INTRAMUSCULAR | Status: AC
  Administered 2017-07-30: 20 mL
  Filled 2017-07-30: qty 20

## 2017-07-30 NOTE — ED Triage Notes (Signed)
Pt presents with c/o right foot pain from ingrown toe nail

## 2017-07-30 NOTE — ED Notes (Signed)
ED Provider at bedside. 

## 2017-07-30 NOTE — Discharge Instructions (Signed)
Use Tylenol or ibuprofen as needed for pain. Keep the nail clean and covered. Wash daily with soap and water. Follow-up with your primary care doctor if symptoms are not improving. Return to the emergency room if you develop fever, chills, worsening pain, purulent drainage, streaking redness up your foot, or any new or worsening symptoms.

## 2017-07-30 NOTE — ED Provider Notes (Signed)
[facility]   CSN: [id number] Arrival date & time: 07/30/17  2036     History   Chief Complaint Chief Complaint  Patient presents with  . Foot Pain    HPI Heather Khan is a 31 y.o. female presenting with right toe pain.  She has a history of ingrown toenails, and she is having recurrence of symptoms. The pain is of the medial side of the right great toe. She reports pain has been there for 3 days, worsening daily. She denies injury or trauma to the toe. She denies drainage or warmth. She denies history of paronychia. She reports her dad usually assists by releasing the ingrown toenail, but he is unable to do so this time. She denies injury or pain elsewhere. She denies numbness or tingling. She is able to move her toes without difficulty. She is not on blood thinners. Her last tetanus shot was last year.   HPI  Past Medical History:  Diagnosis Date  . ADD (attention deficit disorder)   . Amenorrhea 01/28/2016  . Anger   . Anxiety    Bipolar--takes no meds  . Bipolar 1 disorder (HCC)   . Miscarriage, threatened, early pregnancy 02/03/2015  . OCD (obsessive compulsive disorder)   . Plantar fasciitis, bilateral   . PTSD (post-traumatic stress disorder)     Patient Active Problem List   Diagnosis Date Noted  . Amenorrhea 01/28/2016  . Depression 05/16/2015  . Severe obesity (BMI >= 40) (HCC) 06/10/2014  . Irregular menstrual cycle 06/10/2014    Past Surgical History:  Procedure Laterality Date  . APPENDECTOMY    . FINGER SURGERY     right pinkie finger  has  2 pins in it  . MULTIPLE EXTRACTIONS WITH ALVEOLOPLASTY N/A 07/02/2013   Procedure: MULTIPLE EXTRACION 3, 4, 5, 8, 9, 10, 11, 12, 14, 18, 19, 29, 30 WITH ALVEOLOPLASTY;  Surgeon: Georgia Lopes, DDS;  Location: [facility] OR;  Service: Oral Surgery;  Laterality: N/A;    OB History    Gravida Para Term Preterm AB Living   SAB TAB Ectopic Multiple Live Births   1                Home Medications    Prior to Admission medications   Medication Sig Start Date End Date Taking? Authorizing Provider  albuterol (PROVENTIL HFA;VENTOLIN HFA) 108 (90 BASE) MCG/ACT inhaler Inhale 2 puffs into the lungs every 6 (six) hours as needed for wheezing or shortness of breath. 08/27/15   Dettinger, Elige Radon, MD  cyclobenzaprine (FLEXERIL) 10 MG tablet Take 1 tablet (10 mg total) by mouth 3 (three) times daily as needed for muscle spasms. 02/05/16   Dettinger, Elige Radon, MD  divalproex (DEPAKOTE) 500 MG DR tablet Take 500 mg by mouth 3 (three) times daily.    [provider]  doxycycline (VIBRAMYCIN) 100 MG capsule Take 1 capsule (100 mg total) by mouth 2 (two) times daily. One po bid x 7 days 06/02/17   Palumbo, April, MD  guaiFENesin-codeine [facility]) 100-10 MG/5ML syrup Take 5 mLs by mouth every 4 (four) hours as needed for cough. 02/02/16   Mechele Claude, MD  levofloxacin (LEVAQUIN) 500 MG tablet Take 1 tablet (500 mg total) by mouth daily. For 10 days 02/02/16   Mechele Claude, MD  medroxyPROGESTERone (PROVERA) 10 MG tablet Take 1 tablet (10 mg total) by mouth daily. 01/28/16   Cyril Mourning  A, NP  mupirocin cream (BACTROBAN) 2 % Apply 1 application topically 2 (two) times daily. 06/02/17   Palumbo, April, MD  naproxen (NAPROSYN) 375 MG tablet Take 1 tablet (375 mg total) by mouth 2 (two) times daily. 06/02/17   Palumbo, April, MD  ondansetron (ZOFRAN) 4 MG tablet Take 1 tablet (4 mg total) by mouth every 8 (eight) hours as needed for nausea or vomiting. 01/20/16   Junie Spencer, FNP    Family History Family History  Problem Relation Age of Onset  . Hypertension Mother   . Diabetes Father   . Other Father        had throat surgery  . Depression Father   . Healthy Sister   . Healthy Son   . Healthy Sister   . Kidney disease Maternal Grandfather   . Diabetes Maternal Grandfather   . Cancer Maternal Grandmother   . Stroke Paternal Grandfather   . Heart attack  Paternal Grandfather     Social History Social History  Substance Use Topics  . Smoking status: Current Every Day Smoker    Packs/day: 2.00    Years: 12.00    Types: Cigarettes  . Smokeless tobacco: Never Used  . Alcohol use Yes     Comment: rarely     Allergies   Erythromycin; Penicillins; Iohexol; and Zoloft [sertraline hcl]   Review of Systems Review of Systems  Musculoskeletal: Positive for arthralgias.  Neurological: Negative for numbness.  Hematological: Does not bruise/bleed easily.     Physical Exam Updated Vital Signs BP 99/76 (BP Location: Right Arm)   Pulse 85   Temp 97.7 F (36.5 C) (Oral)   Resp 18   Ht  (1.854 m)   Wt 124.7 kg (275 lb)   LMP 07/25/2017   SpO2 95%   BMI 36.28 kg/m   Physical Exam  Constitutional: She is oriented to person, place, and time. She appears well-developed and well-nourished. No distress.  HENT:  Head: Normocephalic and atraumatic.  Eyes: EOM are normal.  Neck: Normal range of motion.  Pulmonary/Chest: Effort normal.  Abdominal: She exhibits no distension.  Musculoskeletal: Normal range of motion.  Tenderness palpation of medial right great toe where skin meets the nail. No fluctuance or induration. No drainage. Pedal pulses intact bilaterally. Full active range of motion of toes without pain.   Neurological: She is alert and oriented to person, place, and time.  Skin: Skin is warm. No rash noted.  Psychiatric: She has a normal mood and affect.  Nursing note and vitals reviewed.    ED Treatments / Results  Labs (all labs ordered are listed, but only abnormal results are displayed) Labs Reviewed - No data to display  EKG  EKG Interpretation None       Radiology No results found.  Procedures Excise ingrown toenail Date/Time: 07/30/2017 9:58 PM Performed by: Alveria Apley Authorized by: Alveria Apley  Consent: Verbal consent obtained. Consent given by: patient Local anesthesia used:  yes  Anesthesia: Local anesthesia used: yes Local Anesthetic: lidocaine 1% without epinephrine Anesthetic total: 5 mL  Sedation: Patient sedated: no Patient tolerance: Patient tolerated the procedure well with no immediate complications Comments: Digital block of right great toe performed. Medial nail cut with scissors, and nail separated from nail bed. Medial nail removed. Minimal bleeding. She tolerated procedure well without complication.    (including critical care time)  Medications Ordered in ED Medications  lidocaine (PF) (XYLOCAINE) 1 % injection 10 mL (10 mLs Infiltration Not Given  07/30/17 2155)  lidocaine (XYLOCAINE) 1 % (with pres) injection (20 mLs  Given by Other 07/30/17 2137)     Initial Impression / Assessment and Plan / ED Course  I have reviewed the triage vital signs and the nursing notes.  Pertinent labs & imaging results that were available during my care of the patient were reviewed by me and considered in my medical decision making (see chart for details).     Patient presenting with three-day history of symptoms of ingrown toenail. History of similar. No fluctuance or signs of abscess/paronychia. Medial portion of toenail excised. Aftercare and wound care instructions given. Patient to follow-up with her primary care as needed. At this time, patient appears safe for discharge. Return precautions given. Patient states she understands and agrees to plan.   Final Clinical Impressions(s) / ED Diagnoses   Final diagnoses:  Ingrown toenail of right foot    New Prescriptions Discharge Medication List as of 07/30/2017 10:00 PM       Alveria Apley, PA-C 07/31/17 0051    Cathren Laine, MD 07/31/17 1658

## 2017-07-30 NOTE — ED Notes (Signed)
C/o right great toe ingrown nail.  Open sores to BLE noted.  No drainage, no pain.  Per pt it is a bug bite from sitting outside.

## 2020-06-06 ENCOUNTER — Encounter (HOSPITAL_BASED_OUTPATIENT_CLINIC_OR_DEPARTMENT_OTHER): Payer: Self-pay

## 2020-06-06 ENCOUNTER — Emergency Department (HOSPITAL_BASED_OUTPATIENT_CLINIC_OR_DEPARTMENT_OTHER)
Admission: EM | Admit: 2020-06-06 | Discharge: 2020-06-06 | Disposition: A | Discharge status: Home / Self Care | Payer: Medicaid Other | Attending: Emergency Medicine | Admitting: Emergency Medicine

## 2020-06-06 ENCOUNTER — Other Ambulatory Visit: Payer: Self-pay

## 2020-06-06 DIAGNOSIS — R21 Rash and other nonspecific skin eruption: Secondary | ICD-10-CM

## 2020-06-06 DIAGNOSIS — F909 Attention-deficit hyperactivity disorder, unspecified type: Secondary | ICD-10-CM | POA: Insufficient documentation

## 2020-06-06 DIAGNOSIS — F17210 Nicotine dependence, cigarettes, uncomplicated: Secondary | ICD-10-CM | POA: Diagnosis not present

## 2020-06-06 DIAGNOSIS — Z7951 Long term (current) use of inhaled steroids: Secondary | ICD-10-CM | POA: Insufficient documentation

## 2020-06-06 DIAGNOSIS — L209 Atopic dermatitis, unspecified: Secondary | ICD-10-CM | POA: Diagnosis not present

## 2020-06-06 MED ORDER — HYDROXYZINE HCL 25 MG PO TABS: 25.0000 mg | ORAL_TABLET | Freq: Four times a day (QID) | ORAL | 0 refills | Status: DC

## 2020-06-06 MED ORDER — PREDNISONE 10 MG PO TABS
40.0000 mg | ORAL_TABLET | Freq: Every day | ORAL | 0 refills | Status: AC
Start: 2020-06-06 — End: 2020-06-11

## 2020-06-06 NOTE — ED Triage Notes (Signed)
Pt c/osfattered ras x "a year"-states she has been seen x 4 including dermatologist-NAD-steady gait

## 2020-06-07 NOTE — ED Provider Notes (Signed)
[facility] EMERGENCY DEPARTMENT Provider Note   CSN: [id number] Arrival date & time: 06/06/20  1601     History Chief Complaint  Patient presents with  . Rash    Heather Khan is a 34 y.o. female.  HPI      34yo female with history below presents with concern for rash. Has been present/waxing and waning for the last year in different locations. Currently has it over chest and over the last week the abdomen. Severe itching.  No known new exposures with exception of new deodorant.  Has used triamcinolone, calamine location, taken benadryl without much relief. Saw dermatology but did not have rash at the time and reports they told her their biggest concern for her was her weight.  Has not had oral steroids. No sick contacts with same. No fevers, dyspnea or other concerns.    Past Medical History:  Diagnosis Date  . ADD (attention deficit disorder)   . Amenorrhea 01/28/2016  . Anger   . Anxiety    Bipolar--takes no meds  . Bipolar 1 disorder (HCC)   . Miscarriage, threatened, early pregnancy 02/03/2015  . OCD (obsessive compulsive disorder)   . Plantar fasciitis, bilateral   . PTSD (post-traumatic stress disorder)     Patient Active Problem List   Diagnosis Date Noted  . Amenorrhea 01/28/2016  . Depression 05/16/2015  . Severe obesity (BMI >= 40) (HCC) 06/10/2014  . Irregular menstrual cycle 06/10/2014    Past Surgical History:  Procedure Laterality Date  . APPENDECTOMY    . FINGER SURGERY     right pinkie finger  has  2 pins in it  . MULTIPLE EXTRACTIONS WITH ALVEOLOPLASTY N/A 07/02/2013   Procedure: MULTIPLE EXTRACION 3, 4, 5, 8, 9, 10, 11, 12, 14, 18, 19, 29, 30 WITH ALVEOLOPLASTY;  Surgeon: Georgia Lopes, DDS;  Location: [facility] OR;  Service: Oral Surgery;  Laterality: N/A;     OB History    Gravida  3   Para  1   Term      Preterm      AB  1   Living        SAB  1   TAB      Ectopic      Multiple      Live Births               Family History  Problem Relation Age of Onset  . Hypertension Mother   . Diabetes Father   . Other Father        had throat surgery  . Depression Father   . Healthy Sister   . Healthy Son   . Healthy Sister   . Kidney disease Maternal Grandfather   . Diabetes Maternal Grandfather   . Cancer Maternal Grandmother   . Stroke Paternal Grandfather   . Heart attack Paternal Grandfather     Social History   Tobacco Use  . Smoking status: Current Every Day Smoker    Packs/day: 2.00    Years: 12.00    Pack years: 24.00    Types: Cigarettes  . Smokeless tobacco: Never Used  Vaping Use  . Vaping Use: Never used  Substance Use Topics  . Alcohol use: Not Currently  . Drug use: No    Home Medications Prior to Admission medications   Medication Sig Start Date End Date Taking? Authorizing Provider  albuterol (PROVENTIL HFA;VENTOLIN HFA) 108 (90 BASE) MCG/ACT inhaler Inhale 2 puffs into the lungs every 6 (  six) hours as needed for wheezing or shortness of breath. 08/27/15   Dettinger, Elige Radon, MD  cyclobenzaprine (FLEXERIL) 10 MG tablet Take 1 tablet (10 mg total) by mouth 3 (three) times daily as needed for muscle spasms. 02/05/16   Dettinger, Elige Radon, MD  divalproex (DEPAKOTE) 500 MG DR tablet Take 500 mg by mouth 3 (three) times daily.    [provider]  doxycycline (VIBRAMYCIN) 100 MG capsule Take 1 capsule (100 mg total) by mouth 2 (two) times daily. One po bid x 7 days 06/02/17   Palumbo, April, MD  guaiFENesin-codeine [facility]) 100-10 MG/5ML syrup Take 5 mLs by mouth every 4 (four) hours as needed for cough. 02/02/16   Mechele Claude, MD  hydrOXYzine (ATARAX/VISTARIL) 25 MG tablet Take 1 tablet (25 mg total) by mouth every 6 (six) hours. 06/06/20   Alvira Monday, MD  levofloxacin (LEVAQUIN) 500 MG tablet Take 1 tablet (500 mg total) by mouth daily. For 10 days 02/02/16   Mechele Claude, MD  medroxyPROGESTERone (PROVERA) 10 MG tablet Take 1 tablet (10 mg total) by  mouth daily. 01/28/16   Adline Potter, NP  mupirocin cream (BACTROBAN) 2 % Apply 1 application topically 2 (two) times daily. 06/02/17   Palumbo, April, MD  naproxen (NAPROSYN) 375 MG tablet Take 1 tablet (375 mg total) by mouth 2 (two) times daily. 06/02/17   Palumbo, April, MD  ondansetron (ZOFRAN) 4 MG tablet Take 1 tablet (4 mg total) by mouth every 8 (eight) hours as needed for nausea or vomiting. 01/20/16   Jannifer Rodney A, FNP  predniSONE (DELTASONE) 10 MG tablet Take 4 tablets (40 mg total) by mouth daily for 5 days. 06/06/20 06/11/20  Alvira Monday, MD    Allergies    Erythromycin, Penicillins, Iohexol, and Zoloft [sertraline hcl]  Review of Systems   Review of Systems  Constitutional: Negative for fever.  Respiratory: Negative for cough and shortness of breath.   Cardiovascular: Negative for chest pain.  Gastrointestinal: Negative for nausea and vomiting.  Skin: Positive for rash.    Physical Exam Updated Vital Signs BP 102/70 (BP Location: Left Arm)   Pulse 95   Temp 98.3 F (36.8 C) (Oral)   Resp 18   Ht 6\' 1"  (1.854 m)   Wt (!) 145.6 kg   SpO2 97%   BMI 42.35 kg/m   Physical Exam Vitals and nursing note reviewed.  Constitutional:      General: She is not in acute distress.    Appearance: Normal appearance. She is not ill-appearing, toxic-appearing or diaphoretic.  HENT:     Head: Normocephalic.  Eyes:     Conjunctiva/sclera: Conjunctivae normal.  Cardiovascular:     Rate and Rhythm: Normal rate and regular rhythm.     Pulses: Normal pulses.  Pulmonary:     Effort: Pulmonary effort is normal. No respiratory distress.  Musculoskeletal:        General: No deformity or signs of injury.     Cervical back: No rigidity.  Skin:    General: Skin is warm and dry.     Coloration: Skin is not jaundiced or pale.     Findings: Rash (tiny papules scattered over upper chest, abdomen, left upper arm, excoriations over abdomen, no surrounding erythema . no lesions in  web space of hands) present.  Neurological:     General: No focal deficit present.     Mental Status: She is alert and oriented to person, place, and time.  ED Results / Procedures / Treatments   Labs (all labs ordered are listed, but only abnormal results are displayed) Labs Reviewed - No data to display  EKG None  Radiology No results found.  Procedures Procedures (including critical care time)  Medications Ordered in ED Medications - No data to display  ED Course  I have reviewed the triage vital signs and the nursing notes.  Pertinent labs & imaging results that were available during my care of the patient were reviewed by me and considered in my medical decision making (see chart for details).    MDM Rules/Calculators/A&P                          34yo female with history below presents with concern for rash.   Rash does not have the appearance of SSS, TEN, erythroderma, scabies, RMSF or hives.  Suspect atopic or contact dermatitis.  Has tried steroid creams with continued itching and symptoms. WIll rx prednisone and hydroxyzine, recommend PCP and dermatology follow up.       Final Clinical Impression(s) / ED Diagnoses Final diagnoses:  Rash and nonspecific skin eruption  Atopic dermatitis, unspecified type    Rx / DC Orders ED Discharge Orders         Ordered    predniSONE (DELTASONE) 10 MG tablet  Daily     Discontinue  Reprint     06/06/20 1854    hydrOXYzine (ATARAX/VISTARIL) 25 MG tablet  Every 6 hours     Discontinue  Reprint     06/06/20 1854           Alvira Monday, MD 06/07/20 1055

## 2020-07-15 ENCOUNTER — Encounter (HOSPITAL_BASED_OUTPATIENT_CLINIC_OR_DEPARTMENT_OTHER): Payer: Self-pay | Admitting: Emergency Medicine

## 2020-07-15 ENCOUNTER — Emergency Department (HOSPITAL_BASED_OUTPATIENT_CLINIC_OR_DEPARTMENT_OTHER)
Admission: EM | Admit: 2020-07-15 | Discharge: 2020-07-16 | Disposition: A | Discharge status: Home / Self Care | Payer: Medicaid Other | Attending: Emergency Medicine | Admitting: Emergency Medicine

## 2020-07-15 ENCOUNTER — Other Ambulatory Visit: Payer: Self-pay

## 2020-07-15 DIAGNOSIS — M25552 Pain in left hip: Secondary | ICD-10-CM | POA: Diagnosis not present

## 2020-07-15 DIAGNOSIS — M79602 Pain in left arm: Secondary | ICD-10-CM | POA: Insufficient documentation

## 2020-07-15 DIAGNOSIS — M79662 Pain in left lower leg: Secondary | ICD-10-CM | POA: Insufficient documentation

## 2020-07-15 DIAGNOSIS — R519 Headache, unspecified: Secondary | ICD-10-CM | POA: Insufficient documentation

## 2020-07-15 DIAGNOSIS — Z5321 Procedure and treatment not carried out due to patient leaving prior to being seen by health care provider: Secondary | ICD-10-CM | POA: Diagnosis not present
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
## 2020-07-16 ENCOUNTER — Encounter (HOSPITAL_BASED_OUTPATIENT_CLINIC_OR_DEPARTMENT_OTHER): Payer: Self-pay

## 2020-07-16 NOTE — ED Notes (Signed)
Pt states she is going to leave  States she does not want to stay to be seen

## 2022-03-16 ENCOUNTER — Encounter: Payer: Self-pay | Admitting: Family Medicine

## 2022-03-16 ENCOUNTER — Ambulatory Visit (INDEPENDENT_AMBULATORY_CARE_PROVIDER_SITE_OTHER): Payer: Medicaid Other | Admitting: Family Medicine

## 2022-03-16 VITALS — BP 107/71 | HR 80 | Temp 98.8°F | Ht 71.0 in | Wt 310.0 lb

## 2022-03-16 DIAGNOSIS — F419 Anxiety disorder, unspecified: Secondary | ICD-10-CM

## 2022-03-16 DIAGNOSIS — L0291 Cutaneous abscess, unspecified: Secondary | ICD-10-CM

## 2022-03-16 DIAGNOSIS — L282 Other prurigo: Secondary | ICD-10-CM

## 2022-03-16 DIAGNOSIS — F319 Bipolar disorder, unspecified: Secondary | ICD-10-CM | POA: Diagnosis not present

## 2022-03-16 DIAGNOSIS — F4310 Post-traumatic stress disorder, unspecified: Secondary | ICD-10-CM | POA: Diagnosis not present

## 2022-03-16 DIAGNOSIS — F339 Major depressive disorder, recurrent, unspecified: Secondary | ICD-10-CM

## 2022-03-16 LAB — BAYER DCA HB A1C WAIVED: HB A1C (BAYER DCA - WAIVED): 5.5 % (ref 4.8–5.6)

## 2022-03-16 MED ORDER — DOXYCYCLINE HYCLATE 100 MG PO TABS: 100.0000 mg | ORAL_TABLET | Freq: Two times a day (BID) | ORAL | 0 refills | Status: AC

## 2022-03-16 MED ORDER — TRIAMCINOLONE ACETONIDE 0.1 % EX CREA: 1.0000 "application " | TOPICAL_CREAM | Freq: Two times a day (BID) | CUTANEOUS | 0 refills | Status: AC

## 2022-03-16 MED ORDER — CHLORHEXIDINE GLUCONATE 4 % EX LIQD: Freq: Every day | CUTANEOUS | 0 refills | Status: DC | PRN

## 2022-03-16 NOTE — Progress Notes (Signed)
Subjective:  Patient ID: Heather Khan, female    DOB: Sep 30, 1986, 36 y.o.   MRN: [id number]  Patient Care Team: Baruch Gouty, FNP as PCP - General (Family Medicine) [place]   Chief Complaint:  New Patient (Initial Visit) (Boils, open sores)   HPI: Heather Khan is a 36 y.o. female presenting on 03/16/2022 for New Patient (Initial Visit) (Boils, open sores)   Pt presents today to establish care with new provider and for evaluation of recurrent skin abscesses. She has a history of Bipolar I disorder with depression, PTSD, anxiety, and morbid obesity. She was last seen by PCP several years ago as she moved to [place], [place] for a few years. She states he has not been on any medications in several years. She has not seen psychiatry in several years. She denies SI or HI. Her biggest concern today is recurrent abscesses to her left breast. She has been using over the counter topical emu cream with some relief but no resolution of symptoms. No fever, chills, weakness, or confusion. She also reports a pruritic rash to her left elbow. Intermittent. No known trigger. Family history of eczema.   Prior EHR records reviewed in detail.     03/16/2022    2:36 PM 02/05/2016    3:31 PM 08/27/2015    8:36 AM 08/11/2015    4:18 PM 05/16/2015    4:25 PM  Depression screen PHQ 2/9  Decreased Interest 3 0 0 2 3  Down, Depressed, Hopeless 2 0 0 1 1  PHQ - 2 Score 5 0 0 3 4  Altered sleeping 3   3 0  Tired, decreased energy 2   3 3   Change in appetite 1   2 3   Feeling bad or failure about yourself  3   2 3   Trouble concentrating 2   3 3   Moving slowly or fidgety/restless 1   0 2  Suicidal thoughts 0   1 1  PHQ-9 Score 17   17 19   Difficult doing work/chores Very difficult          03/16/2022    2:37 PM  GAD 7 : Generalized Anxiety Score  Nervous, Anxious, on Edge 2  Control/stop worrying 2  Worry too much - different things 3  Trouble relaxing 0   Restless 0  Easily annoyed or irritable 2  Afraid - awful might happen 1  Total GAD 7 Score 10  Anxiety Difficulty Very difficult      Relevant past medical, surgical, family, and social history reviewed and updated as indicated.  Allergies and medications reviewed and updated. Data reviewed: Chart in Epic.   Past Medical History:  Diagnosis Date   ADD (attention deficit disorder)    Amenorrhea 01/28/2016   Anger    Anxiety    Bipolar--takes no meds   Bipolar 1 disorder (HCC)    Miscarriage, threatened, early pregnancy 02/03/2015   OCD (obsessive compulsive disorder)    Plantar fasciitis, bilateral    PTSD (post-traumatic stress disorder)     Past Surgical History:  Procedure Laterality Date   APPENDECTOMY     FINGER SURGERY     right pinkie finger  has  2 pins in it   MULTIPLE EXTRACTIONS WITH ALVEOLOPLASTY N/A 07/02/2013   Procedure: MULTIPLE EXTRACION 3, 4, 5, 8, 9, 10, 11, 12, 14, 18, 19, 29, 30 WITH ALVEOLOPLASTY;  Surgeon: Gae Bon, DDS;  Location: [facility];  Service: Oral Surgery;  Laterality: N/A;    Social History   Socioeconomic History   Marital status: Married    Spouse name: Not on file   Number of children: Not on file   Years of education: Not on file   Highest education level: Not on file  Occupational History   Not on file  Tobacco Use   Smoking status: Every Day    Packs/day: 2.00    Years: 12.00    Pack years: 24.00    Types: Cigarettes   Smokeless tobacco: Never  Vaping Use   Vaping Use: Never used  Substance and Sexual Activity   Alcohol use: Not Currently   Drug use: No   Sexual activity: Not on file  Other Topics Concern   Not on file  Social History Narrative   ** Merged History Encounter **       Social Determinants of Health   Financial Resource Strain: Not on file  Food Insecurity: Not on file  Transportation Needs: Not on file  Physical Activity: Not on file  Stress: Not on file  Social Connections: Not on file   Intimate Partner Violence: Not on file    Outpatient Encounter Medications as of 03/16/2022  Medication Sig   chlorhexidine (HIBICLENS) 4 % external liquid Apply topically daily as needed.   doxycycline (VIBRA-TABS) 100 MG tablet Take 1 tablet (100 mg total) by mouth 2 (two) times daily for 10 days. 1 po bid   triamcinolone cream (KENALOG) 0.1 % Apply 1 application. topically 2 (two) times daily.   [DISCONTINUED] albuterol (PROVENTIL HFA;VENTOLIN HFA) 108 (90 BASE) MCG/ACT inhaler Inhale 2 puffs into the lungs every 6 (six) hours as needed for wheezing or shortness of breath.   [DISCONTINUED] cyclobenzaprine (FLEXERIL) 10 MG tablet Take 1 tablet (10 mg total) by mouth 3 (three) times daily as needed for muscle spasms.   [DISCONTINUED] divalproex (DEPAKOTE) 500 MG DR tablet Take 500 mg by mouth 3 (three) times daily.   [DISCONTINUED] doxycycline (VIBRAMYCIN) 100 MG capsule Take 1 capsule (100 mg total) by mouth 2 (two) times daily. One po bid x 7 days   [DISCONTINUED] guaiFENesin-codeine (CHERATUSSIN AC) 100-10 MG/5ML syrup Take 5 mLs by mouth every 4 (four) hours as needed for cough.   [DISCONTINUED] hydrOXYzine (ATARAX/VISTARIL) 25 MG tablet Take 1 tablet (25 mg total) by mouth every 6 (six) hours.   [DISCONTINUED] levofloxacin (LEVAQUIN) 500 MG tablet Take 1 tablet (500 mg total) by mouth daily. For 10 days   [DISCONTINUED] medroxyPROGESTERone (PROVERA) 10 MG tablet Take 1 tablet (10 mg total) by mouth daily.   [DISCONTINUED] mupirocin cream (BACTROBAN) 2 % Apply 1 application topically 2 (two) times daily.   [DISCONTINUED] naproxen (NAPROSYN) 375 MG tablet Take 1 tablet (375 mg total) by mouth 2 (two) times daily.   [DISCONTINUED] ondansetron (ZOFRAN) 4 MG tablet Take 1 tablet (4 mg total) by mouth every 8 (eight) hours as needed for nausea or vomiting.   No facility-administered encounter medications on file as of 03/16/2022.    Allergies  Allergen Reactions   Buspirone Hives    Erythromycin Rash   Penicillins     Throat swells-all "cillins"   Amoxicillin    Erythromycin Base    Iohexol     Unknown   Ivp Dye [Iodinated Contrast Media]    Penicillins    Zoloft [Sertraline]    Zoloft [Sertraline Hcl] Rash    Review of Systems  Constitutional:  Positive for activity change, appetite change and fatigue. Negative for chills,  diaphoresis, fever and unexpected weight change.  HENT: Negative.    Eyes: Negative.  Negative for photophobia and visual disturbance.  Respiratory:  Negative for apnea, cough, choking, chest tightness, shortness of breath, wheezing and stridor.   Cardiovascular:  Negative for chest pain, palpitations and leg swelling.  Gastrointestinal:  Negative for blood in stool, constipation, diarrhea, nausea and vomiting.  Endocrine: Negative.   Genitourinary:  Negative for decreased urine volume, difficulty urinating, dysuria, frequency and urgency.  Musculoskeletal:  Negative for arthralgias and myalgias.  Skin:  Positive for color change and wound.  Allergic/Immunologic: Negative.   Neurological:  Negative for dizziness, tremors, seizures, syncope, facial asymmetry, speech difficulty, weakness, light-headedness, numbness and headaches.  Hematological: Negative.   Psychiatric/Behavioral:  Positive for agitation, decreased concentration, dysphoric mood and sleep disturbance. Negative for behavioral problems, confusion, hallucinations, self-injury and suicidal ideas. The patient is nervous/anxious. The patient is not hyperactive.   All other systems reviewed and are negative.      Objective:  BP 107/71   Pulse 80   Temp 98.8 F (37.1 C)   Ht $R'5\' 11"'GI$  (1.803 m)   Wt (!) 310 lb (140.6 kg)   LMP 02/17/2022 (Approximate)   SpO2 95%   BMI 43.24 kg/m    Wt Readings from Last 3 Encounters:  03/16/22 (!) 310 lb (140.6 kg)  07/15/20 (!) 325 lb (147.4 kg)  06/06/20 (!) 321 lb (145.6 kg)    Physical Exam Vitals and nursing note reviewed.   Constitutional:      General: She is not in acute distress.    Appearance: Normal appearance. She is well-developed and well-groomed. She is morbidly obese. She is not ill-appearing, toxic-appearing or diaphoretic.  HENT:     Head: Normocephalic and atraumatic.     Jaw: There is normal jaw occlusion.     Right Ear: Hearing normal.     Left Ear: Hearing normal.     Nose: Nose normal.     Mouth/Throat:     Lips: Pink.     Mouth: Mucous membranes are moist.     Pharynx: Oropharynx is clear. Uvula midline.  Eyes:     General: Lids are normal.     Conjunctiva/sclera: Conjunctivae normal.     Pupils: Pupils are equal, round, and reactive to light.  Neck:     Thyroid: No thyroid mass, thyromegaly or thyroid tenderness.     Vascular: No carotid bruit or JVD.     Trachea: Trachea and phonation normal.  Cardiovascular:     Rate and Rhythm: Normal rate and regular rhythm.     Chest Wall: PMI is not displaced.     Pulses: Normal pulses.     Heart sounds: Normal heart sounds. No murmur heard.   No friction rub. No gallop.  Pulmonary:     Effort: Pulmonary effort is normal.     Breath sounds: Normal breath sounds. No wheezing.  Abdominal:     General: Bowel sounds are normal. There is no abdominal bruit.     Palpations: Abdomen is soft. There is no hepatomegaly or splenomegaly.  Musculoskeletal:        General: Normal range of motion.     Cervical back: Normal range of motion and neck supple.     Right lower leg: No edema.     Left lower leg: No edema.  Lymphadenopathy:     Cervical: No cervical adenopathy.  Skin:    General: Skin is warm and dry.     Capillary Refill:  Capillary refill takes less than 2 seconds.     Coloration: Skin is not cyanotic, jaundiced or pale.     Findings: Abscess, erythema and rash present. Rash is papular.       Neurological:     General: No focal deficit present.     Mental Status: She is alert and oriented to person, place, and time.     Cranial  Nerves: No cranial nerve deficit.     Sensory: Sensation is intact.     Motor: Motor function is intact. No weakness.     Coordination: Coordination is intact.     Gait: Gait is intact. Gait normal.     Deep Tendon Reflexes: Reflexes are normal and symmetric.  Psychiatric:        Attention and Perception: Attention and perception normal.        Mood and Affect: Mood and affect normal.        Speech: Speech normal.        Behavior: Behavior normal. Behavior is cooperative.        Thought Content: Thought content normal.        Cognition and Memory: Cognition and memory normal.        Judgment: Judgment normal.    Results for orders placed or performed in visit on 01/28/16  POCT urine pregnancy  Result Value Ref Range   Preg Test, Ur Negative Negative       Pertinent labs & imaging results that were available during my care of the patient were reviewed by me and considered in my medical decision making.  Assessment & Plan:  Heather Khan was seen today for new patient (initial visit).  Diagnoses and all orders for this visit:  Abscess Concerning for HS. Will treat with oral doxycycline as prescribed. Hibiclens twice weekly, use discussed in detail. Pt to follow up for reevaluation in 4 weeks or sooner if warranted.  -     doxycycline (VIBRA-TABS) 100 MG tablet; Take 1 tablet (100 mg total) by mouth 2 (two) times daily for 10 days. 1 po bid -     chlorhexidine (HIBICLENS) 4 % external liquid; Apply topically daily as needed. -     CBC with Differential/Platelet  Pruritic rash Will trial steroid cream as prescribed. Jar emollients advised.  -     triamcinolone cream (KENALOG) 0.1 %; Apply 1 application. topically 2 (two) times daily.  Bipolar I disorder with depression ([place]) PTSD (post-traumatic stress disorder) Anxiety Depression, recurrent ([place]) Will place referral to psychiatry and check thyroid function.  -     Thyroid Panel With TSH -     Ambulatory referral to  Psychiatry  Morbid obesity ([place]) Will obtain labs as pt has not had any in several years. Diet and exercise encouraged.  -     CBC with Differential/Platelet -     CMP14+EGFR -     Lipid panel -     Thyroid Panel With TSH -     Bayer DCA Hb A1c Waived   Continue all other maintenance medications.  Follow up plan: Return in about 4 weeks (around 04/13/2022), or if symptoms worsen or fail to improve, for reeval of skin.   Continue healthy lifestyle choices, including diet (rich in fruits, vegetables, and lean proteins, and low in salt and simple carbohydrates) and exercise (at least 30 minutes of moderate physical activity daily).  Educational handout given for skin abscess   The above assessment and management plan was discussed with the patient. The  patient verbalized understanding of and has agreed to the management plan. Patient is aware to call the clinic if they develop any new symptoms or if symptoms persist or worsen. Patient is aware when to return to the clinic for a follow-up visit. Patient educated on when it is appropriate to go to the emergency department.   Michelle Serita Degroote, FNP-C [facility] [phone]   

## 2022-03-17 LAB — CMP14+EGFR
ALT: 53 IU/L — ABNORMAL HIGH (ref 0–32)
AST: 38 IU/L (ref 0–40)
Albumin/Globulin Ratio: 1.6 (ref 1.2–2.2)
Albumin: 4.1 g/dL (ref 3.8–4.8)
Alkaline Phosphatase: 108 IU/L (ref 44–121)
BUN/Creatinine Ratio: 9 (ref 9–23)
BUN: 6 mg/dL (ref 6–20)
Bilirubin Total: 0.4 mg/dL (ref 0.0–1.2)
CO2: 20 mmol/L (ref 20–29)
Calcium: 9.3 mg/dL (ref 8.7–10.2)
Chloride: 104 mmol/L (ref 96–106)
Creatinine, Ser: 0.66 mg/dL (ref 0.57–1.00)
Globulin, Total: 2.6 g/dL (ref 1.5–4.5)
Glucose: 162 mg/dL — ABNORMAL HIGH (ref 70–99)
Potassium: 4.1 mmol/L (ref 3.5–5.2)
Sodium: 139 mmol/L (ref 134–144)
Total Protein: 6.7 g/dL (ref 6.0–8.5)
eGFR: 118 mL/min/{1.73_m2} (ref >59)

## 2022-03-17 LAB — CBC WITH DIFFERENTIAL/PLATELET
Basophils Absolute: 0.1 10*3/uL (ref 0.0–0.2)
Basos: 1 %
EOS (ABSOLUTE): 0.2 10*3/uL (ref 0.0–0.4)
Eos: 2 %
Hematocrit: 39.3 % (ref 34.0–46.6)
Hemoglobin: 13 g/dL (ref 11.1–15.9)
Immature Grans (Abs): 0 10*3/uL (ref 0.0–0.1)
Immature Granulocytes: 0 %
Lymphocytes Absolute: 2.4 10*3/uL (ref 0.7–3.1)
Lymphs: 30 %
MCH: 28.8 pg (ref 26.6–33.0)
MCHC: 33.1 g/dL (ref 31.5–35.7)
MCV: 87 fL (ref 79–97)
Monocytes Absolute: 0.5 10*3/uL (ref 0.1–0.9)
Monocytes: 6 %
Neutrophils Absolute: 4.9 10*3/uL (ref 1.4–7.0)
Neutrophils: 61 %
Platelets: 324 10*3/uL (ref 150–450)
RBC: 4.52 x10E6/uL (ref 3.77–5.28)
RDW: 13.2 % (ref 11.7–15.4)
WBC: 8 10*3/uL (ref 3.4–10.8)

## 2022-03-17 LAB — THYROID PANEL WITH TSH
Free Thyroxine Index: 2.2 (ref 1.2–4.9)
T3 Uptake Ratio: 22 % — ABNORMAL LOW (ref 24–39)
T4, Total: 10.2 ug/dL (ref 4.5–12.0)
TSH: 1.01 u[IU]/mL (ref 0.450–4.500)

## 2022-03-17 LAB — LIPID PANEL
Chol/HDL Ratio: 5.2 ratio — ABNORMAL HIGH (ref 0.0–4.4)
Cholesterol, Total: 141 mg/dL (ref 100–199)
HDL: 27 mg/dL — ABNORMAL LOW (ref >39)
LDL Chol Calc (NIH): 88 mg/dL (ref 0–99)
Triglycerides: 144 mg/dL (ref 0–149)
VLDL Cholesterol Cal: 26 mg/dL (ref 5–40)

## 2022-03-29 ENCOUNTER — Ambulatory Visit (INDEPENDENT_AMBULATORY_CARE_PROVIDER_SITE_OTHER): Payer: Medicaid Other | Admitting: Emergency Medicine

## 2022-03-29 DIAGNOSIS — Z111 Encounter for screening for respiratory tuberculosis: Secondary | ICD-10-CM | POA: Diagnosis not present

## 2022-03-31 LAB — TB SKIN TEST
Induration: 0 mm
TB Skin Test: NEGATIVE

## 2022-04-13 ENCOUNTER — Encounter: Payer: Self-pay | Admitting: Family Medicine

## 2022-04-13 ENCOUNTER — Ambulatory Visit (INDEPENDENT_AMBULATORY_CARE_PROVIDER_SITE_OTHER): Payer: Medicaid Other | Admitting: Family Medicine

## 2022-04-13 VITALS — BP 103/67 | HR 84 | Temp 98.7°F | Ht 71.0 in | Wt 310.0 lb

## 2022-04-13 DIAGNOSIS — L0291 Cutaneous abscess, unspecified: Secondary | ICD-10-CM | POA: Diagnosis not present

## 2022-04-13 MED ORDER — SULFAMETHOXAZOLE-TRIMETHOPRIM 800-160 MG PO TABS: 1.0000 | ORAL_TABLET | Freq: Two times a day (BID) | ORAL | 0 refills | Status: AC

## 2022-04-13 NOTE — Progress Notes (Signed)
Subjective:  Patient ID: Heather Khan, female    DOB: [birth year], 36 y.o.   MRN: [id number]  Patient Care Team: Sonny Masters, FNP as PCP - General (Family Medicine) [place], [facility]   Chief Complaint:  skin lesion   HPI: Heather Khan is a 36 y.o. female presenting on 04/13/2022 for skin lesion   Pt presents today for skin abscess follow up. She had multiple abscess at previous visit which were open and draining. She was placed on doxycycline and prescribed hibiclens to use three times per week. Several of the abscess have healed but she now has a new open and draining abscess to right breast. No fever, chills, confusion, weakness, or fatigue. No surrounding erythema.    Relevant past medical, surgical, family, and social history reviewed and updated as indicated.  Allergies and medications reviewed and updated. Data reviewed: Chart in Epic.   Past Medical History:  Diagnosis Date   ADD (attention deficit disorder)    Amenorrhea 01/28/2016   Anger    Anxiety    Bipolar--takes no meds   Bipolar 1 disorder (HCC)    Miscarriage, threatened, early pregnancy 02/03/2015   OCD (obsessive compulsive disorder)    Plantar fasciitis, bilateral    PTSD (post-traumatic stress disorder)     Past Surgical History:  Procedure Laterality Date   APPENDECTOMY     FINGER SURGERY     right pinkie finger  has  2 pins in it   MULTIPLE EXTRACTIONS WITH ALVEOLOPLASTY N/A 07/02/2013   Procedure: MULTIPLE EXTRACION 3, 4, 5, 8, 9, 10, 11, 12, 14, 18, 19, 29, 30 WITH ALVEOLOPLASTY;  Surgeon: Georgia Lopes, DDS;  Location: [facility] OR;  Service: Oral Surgery;  Laterality: N/A;    Social History   Socioeconomic History   Marital status: Married    Spouse name: Not on file   Number of children: Not on file   Years of education: Not on file   Highest education level: Not on file  Occupational History   Not on file  Tobacco Use   Smoking status: Every Day     Packs/day: 2.00    Years: 12.00    Total pack years: 24.00    Types: Cigarettes   Smokeless tobacco: Never  Vaping Use   Vaping Use: Never used  Substance and Sexual Activity   Alcohol use: Not Currently   Drug use: No   Sexual activity: Not on file  Other Topics Concern   Not on file  Social History Narrative   ** Merged History Encounter **       Social Determinants of Health   Financial Resource Strain: Not on file  Food Insecurity: Not on file  Transportation Needs: Not on file  Physical Activity: Not on file  Stress: Not on file  Social Connections: Not on file  Intimate Partner Violence: Not on file    Outpatient Encounter Medications as of 04/13/2022  Medication Sig   sulfamethoxazole-trimethoprim (BACTRIM DS) 800-160 MG tablet Take 1 tablet by mouth 2 (two) times daily for 7 days.   triamcinolone cream (KENALOG) 0.1 % Apply 1 application. topically 2 (two) times daily.   [DISCONTINUED] chlorhexidine (HIBICLENS) 4 % external liquid Apply topically daily as needed.   No facility-administered encounter medications on file as of 04/13/2022.    Allergies  Allergen Reactions   Buspirone Hives   Erythromycin Rash   Penicillins     Throat swells-all "cillins"   Amoxicillin  Erythromycin Base    Iohexol     Unknown   Ivp Dye [Iodinated Contrast Media]    Penicillins    Zoloft [Sertraline]    Zoloft [Sertraline Hcl] Rash    Review of Systems  Constitutional:  Negative for activity change, appetite change, chills, diaphoresis, fatigue, fever and unexpected weight change.  HENT: Negative.    Eyes: Negative.  Negative for photophobia and visual disturbance.  Respiratory:  Negative for cough, chest tightness and shortness of breath.   Cardiovascular:  Negative for chest pain, palpitations and leg swelling.  Gastrointestinal:  Negative for abdominal pain, blood in stool, constipation, diarrhea, nausea and vomiting.  Endocrine: Negative.  Negative for heat  intolerance, polydipsia, polyphagia and polyuria.  Genitourinary:  Negative for decreased urine volume, difficulty urinating, dysuria, frequency and urgency.  Musculoskeletal:  Negative for arthralgias and myalgias.  Skin:  Positive for color change and wound.  Allergic/Immunologic: Negative.   Neurological:  Negative for dizziness and headaches.  Hematological: Negative.   Psychiatric/Behavioral:  Negative for confusion, hallucinations, sleep disturbance and suicidal ideas.   All other systems reviewed and are negative.       Objective:  BP 103/67   Pulse 84   Temp 98.7 F (37.1 C)   Ht 5\' 11"  (1.803 m)   Wt (!) 310 lb (140.6 kg)   LMP 02/17/2022 (Approximate)   SpO2 97%   BMI 43.24 kg/m    Wt Readings from Last 3 Encounters:  04/13/22 (!) 310 lb (140.6 kg)  03/16/22 (!) 310 lb (140.6 kg)  07/15/20 (!) 325 lb (147.4 kg)    Physical Exam Vitals and nursing note reviewed.  Constitutional:      General: She is not in acute distress.    Appearance: She is morbidly obese. She is not ill-appearing, toxic-appearing or diaphoretic.  HENT:     Head: Normocephalic and atraumatic.  Eyes:     Pupils: Pupils are equal, round, and reactive to light.  Cardiovascular:     Rate and Rhythm: Normal rate and regular rhythm.     Heart sounds: Normal heart sounds.  Pulmonary:     Effort: Pulmonary effort is normal.     Breath sounds: Normal breath sounds.  Skin:    General: Skin is warm and dry.     Capillary Refill: Capillary refill takes less than 2 seconds.     Findings: Abscess present.       Neurological:     General: No focal deficit present.     Mental Status: She is alert and oriented to person, place, and time.  Psychiatric:        Mood and Affect: Mood normal.        Behavior: Behavior normal. Behavior is cooperative.        Thought Content: Thought content normal.        Judgment: Judgment normal.     Results for orders placed or performed in visit on 03/29/22   TB Skin Test  Result Value Ref Range   TB Skin Test Negative    Induration 0 mm       Pertinent labs & imaging results that were available during my care of the patient were reviewed by me and considered in my medical decision making.  Assessment & Plan:  Heather Khan was seen today for skin lesion.  Diagnoses and all orders for this visit:  Abscess Open and draining abscess to right outer breast, healing abscesses to left breast and abdomen. Has completed doxycycline without resolution  of symptoms. Will place on bactrim. If symptoms persist or worsen, will refer to derm.  -     sulfamethoxazole-trimethoprim (BACTRIM DS) 800-160 MG tablet; Take 1 tablet by mouth 2 (two) times daily for 7 days.     Continue all other maintenance medications.  Follow up plan: Return in about 6 months (around 10/13/2022), or if symptoms worsen or fail to improve.   Continue healthy lifestyle choices, including diet (rich in fruits, vegetables, and lean proteins, and low in salt and simple carbohydrates) and exercise (at least 30 minutes of moderate physical activity daily).  Educational handout given for abscess  The above assessment and management plan was discussed with the patient. The patient verbalized understanding of and has agreed to the management plan. Patient is aware to call the clinic if they develop any new symptoms or if symptoms persist or worsen. Patient is aware when to return to the clinic for a follow-up visit. Patient educated on when it is appropriate to go to the emergency department.   Kari Baars, FNP-C [facility] [phone]

## 2022-05-25 ENCOUNTER — Encounter: Payer: Self-pay | Admitting: Family Medicine

## 2022-05-25 ENCOUNTER — Ambulatory Visit (INDEPENDENT_AMBULATORY_CARE_PROVIDER_SITE_OTHER): Payer: Self-pay | Admitting: Family Medicine

## 2022-05-25 VITALS — BP 122/89 | HR 89 | Temp 98.9°F | Ht 71.0 in | Wt 311.0 lb

## 2022-05-25 DIAGNOSIS — N611 Abscess of the breast and nipple: Secondary | ICD-10-CM

## 2022-05-25 MED ORDER — CEPHALEXIN 500 MG PO CAPS: 500.0000 mg | ORAL_CAPSULE | Freq: Four times a day (QID) | ORAL | 0 refills | Status: AC

## 2022-05-25 NOTE — Progress Notes (Signed)
Subjective:  Patient ID: Heather Khan, female    DOB: [birth year], 36 y.o.   MRN: [id number]  Patient Care Team: Sonny Masters, FNP as PCP - General (Family Medicine) [facility]   Chief Complaint:  concern for mass on breast   HPI: Heather Khan is a 36 y.o. female presenting on 05/25/2022 for concern for mass on breast   Pt presents today for reevaluation of left breast abscess and mass. She was seen in UC on 05/19/2022 for same and diagnosed with breast cellulitis and placed on clindamycin. She states she continues to have pain, swelling, drainage, and redness. No fever, chills, weakness, or confusion.     Relevant past medical, surgical, family, and social history reviewed and updated as indicated.  Allergies and medications reviewed and updated. Data reviewed: Chart in Epic.   Past Medical History:  Diagnosis Date   ADD (attention deficit disorder)    Amenorrhea 01/28/2016   Anger    Anxiety    Bipolar--takes no meds   Bipolar 1 disorder (HCC)    Miscarriage, threatened, early pregnancy 02/03/2015   OCD (obsessive compulsive disorder)    Plantar fasciitis, bilateral    PTSD (post-traumatic stress disorder)     Past Surgical History:  Procedure Laterality Date   APPENDECTOMY     FINGER SURGERY     right pinkie finger  has  2 pins in it   MULTIPLE EXTRACTIONS WITH ALVEOLOPLASTY N/A 07/02/2013   Procedure: MULTIPLE EXTRACION 3, 4, 5, 8, 9, 10, 11, 12, 14, 18, 19, 29, 30 WITH ALVEOLOPLASTY;  Surgeon: Georgia Lopes, DDS;  Location: [facility] OR;  Service: Oral Surgery;  Laterality: N/A;    Social History   Socioeconomic History   Marital status: Married    Spouse name: Not on file   Number of children: Not on file   Years of education: Not on file   Highest education level: Not on file  Occupational History   Not on file  Tobacco Use   Smoking status: Every Day    Packs/day: 2.00    Years: 12.00    Total pack years: 24.00     Types: Cigarettes   Smokeless tobacco: Never  Vaping Use   Vaping Use: Never used  Substance and Sexual Activity   Alcohol use: Not Currently   Drug use: No   Sexual activity: Not on file  Other Topics Concern   Not on file  Social History Narrative   ** Merged History Encounter **       Social Determinants of Health   Financial Resource Strain: Not on file  Food Insecurity: Not on file  Transportation Needs: Not on file  Physical Activity: Not on file  Stress: Not on file  Social Connections: Not on file  Intimate Partner Violence: Not on file    Outpatient Encounter Medications as of 05/25/2022  Medication Sig   cephALEXin (KEFLEX) 500 MG capsule Take 1 capsule (500 mg total) by mouth 4 (four) times daily for 10 days.   clindamycin (CLEOCIN) 150 MG capsule Take by mouth.   triamcinolone cream (KENALOG) 0.1 % Apply 1 application. topically 2 (two) times daily.   No facility-administered encounter medications on file as of 05/25/2022.    Allergies  Allergen Reactions   Buspirone Hives   Erythromycin Rash   Penicillins     Throat swells-all "cillins"   Amoxicillin    Erythromycin Base    Iohexol     Unknown  Ivp Dye [Iodinated Contrast Media]    Penicillins    Zoloft [Sertraline]    Zoloft [Sertraline Hcl] Rash    Review of Systems  Constitutional:  Negative for activity change, appetite change, chills, diaphoresis, fatigue, fever and unexpected weight change.  Eyes:  Negative for photophobia and visual disturbance.  Respiratory:  Negative for cough and shortness of breath.   Cardiovascular:  Negative for chest pain, palpitations and leg swelling.  Gastrointestinal:  Negative for abdominal pain, diarrhea, nausea and vomiting.  Genitourinary:  Negative for decreased urine volume and difficulty urinating.  Skin:  Positive for color change and wound. Negative for pallor and rash.  Neurological:  Negative for dizziness, weakness, light-headedness and headaches.   Psychiatric/Behavioral:  Negative for confusion.   All other systems reviewed and are negative.       Objective:  BP 122/89   Pulse 89   Temp 98.9 F (37.2 C)   Ht 5\' 11"  (1.803 m)   Wt (!) 311 lb (141.1 kg)   LMP 05/03/2022 (Exact Date)   SpO2 95%   BMI 43.38 kg/m    Wt Readings from Last 3 Encounters:  05/25/22 (!) 311 lb (141.1 kg)  04/13/22 (!) 310 lb (140.6 kg)  03/16/22 (!) 310 lb (140.6 kg)    Physical Exam Vitals and nursing note reviewed.  Constitutional:      General: She is not in acute distress.    Appearance: Normal appearance. She is well-developed and well-groomed. She is morbidly obese. She is not ill-appearing, toxic-appearing or diaphoretic.  HENT:     Head: Normocephalic and atraumatic.     Jaw: There is normal jaw occlusion.     Right Ear: Hearing normal.     Left Ear: Hearing normal.     Mouth/Throat:     Lips: Pink.     Mouth: Mucous membranes are moist.     Pharynx: Uvula midline.  Eyes:     General: Lids are normal.     Pupils: Pupils are equal, round, and reactive to light.  Neck:     Thyroid: No thyroid mass, thyromegaly or thyroid tenderness.     Vascular: No carotid bruit or JVD.     Trachea: Trachea and phonation normal.  Cardiovascular:     Rate and Rhythm: Normal rate and regular rhythm.     Chest Wall: PMI is not displaced.     Pulses: Normal pulses.     Heart sounds: Normal heart sounds. No murmur heard.    No friction rub. No gallop.  Pulmonary:     Breath sounds: Normal breath sounds.  Chest:    Abdominal:     General: There is no abdominal bruit.     Palpations: There is no hepatomegaly or splenomegaly.  Musculoskeletal:        General: Normal range of motion.     Cervical back: Normal range of motion and neck supple.     Right lower leg: No edema.     Left lower leg: No edema.  Lymphadenopathy:     Cervical: No cervical adenopathy.  Skin:    General: Skin is warm and dry.     Capillary Refill: Capillary refill  takes less than 2 seconds.     Coloration: Skin is not cyanotic, jaundiced or pale.     Findings: Abscess present. No rash.       Neurological:     General: No focal deficit present.     Mental Status: She is alert and oriented  to person, place, and time.     Sensory: Sensation is intact.     Motor: Motor function is intact.     Coordination: Coordination is intact.     Gait: Gait is intact.     Deep Tendon Reflexes: Reflexes are normal and symmetric.  Psychiatric:        Attention and Perception: Attention and perception normal.        Mood and Affect: Mood and affect normal.        Speech: Speech normal.        Behavior: Behavior normal. Behavior is cooperative.        Thought Content: Thought content normal.        Cognition and Memory: Cognition and memory normal.        Judgment: Judgment normal.     Results for orders placed or performed in visit on 03/29/22  TB Skin Test  Result Value Ref Range   TB Skin Test Negative    Induration 0 mm       Pertinent labs & imaging results that were available during my care of the patient were reviewed by me and considered in my medical decision making.  Assessment & Plan:  Rosalynn was seen today for concern for mass on breast.  Diagnoses and all orders for this visit:  Left breast abscess Will change Clindamycin to Keflex. Referral to general surgery due to recurrent abscesses.  -     Ambulatory referral to General Surgery -     cephALEXin (KEFLEX) 500 MG capsule; Take 1 capsule (500 mg total) by mouth 4 (four) times daily for 10 days.     Continue all other maintenance medications.  Follow up plan: Return if symptoms worsen or fail to improve.   The above assessment and management plan was discussed with the patient. The patient verbalized understanding of and has agreed to the management plan. Patient is aware to call the clinic if they develop any new symptoms or if symptoms persist or worsen. Patient is aware when to  return to the clinic for a follow-up visit. Patient educated on when it is appropriate to go to the emergency department.   Kari Baars, FNP-C [facility] [phone]

## 2022-10-12 ENCOUNTER — Encounter: Payer: Medicaid Other | Admitting: Family Medicine

## 2023-03-04 ENCOUNTER — Emergency Department (HOSPITAL_COMMUNITY): Payer: 59

## 2023-03-04 ENCOUNTER — Other Ambulatory Visit: Payer: Self-pay

## 2023-03-04 ENCOUNTER — Encounter (HOSPITAL_COMMUNITY): Payer: Self-pay

## 2023-03-04 ENCOUNTER — Emergency Department (HOSPITAL_COMMUNITY)
Admission: EM | Admit: 2023-03-04 | Discharge: 2023-03-04 | Disposition: A | Discharge status: Home / Self Care | Payer: 59 | Attending: Emergency Medicine | Admitting: Emergency Medicine

## 2023-03-04 DIAGNOSIS — R1013 Epigastric pain: Secondary | ICD-10-CM | POA: Insufficient documentation

## 2023-03-04 LAB — COMPREHENSIVE METABOLIC PANEL
ALT: 33 U/L (ref 0–44)
AST: 20 U/L (ref 15–41)
Albumin: 3.8 g/dL (ref 3.5–5.0)
Alkaline Phosphatase: 89 U/L (ref 38–126)
Anion gap: 8 (ref 5–15)
BUN: 11 mg/dL (ref 6–20)
CO2: 22 mmol/L (ref 22–32)
Calcium: 9.2 mg/dL (ref 8.9–10.3)
Chloride: 106 mmol/L (ref 98–111)
Creatinine, Ser: 0.73 mg/dL (ref 0.44–1.00)
GFR, Estimated: >60 mL/min (ref >60)
Glucose, Bld: 173 mg/dL — ABNORMAL HIGH (ref 70–99)
Potassium: 3.6 mmol/L (ref 3.5–5.1)
Sodium: 136 mmol/L (ref 135–145)
Total Bilirubin: 0.8 mg/dL (ref 0.3–1.2)
Total Protein: 7.5 g/dL (ref 6.5–8.1)

## 2023-03-04 LAB — CBC WITH DIFFERENTIAL/PLATELET
Abs Immature Granulocytes: 0.04 10*3/uL (ref 0.00–0.07)
Basophils Absolute: 0.1 10*3/uL (ref 0.0–0.1)
Basophils Relative: 1 %
Eosinophils Absolute: 0.1 10*3/uL (ref 0.0–0.5)
Eosinophils Relative: 1 %
HCT: 39.8 % (ref 36.0–46.0)
Hemoglobin: 12.9 g/dL (ref 12.0–15.0)
Immature Granulocytes: 0 %
Lymphocytes Relative: 24 %
Lymphs Abs: 2.4 10*3/uL (ref 0.7–4.0)
MCH: 28.5 pg (ref 26.0–34.0)
MCHC: 32.4 g/dL (ref 30.0–36.0)
MCV: 88.1 fL (ref 80.0–100.0)
Monocytes Absolute: 0.5 10*3/uL (ref 0.1–1.0)
Monocytes Relative: 5 %
Neutro Abs: 7 10*3/uL (ref 1.7–7.7)
Neutrophils Relative %: 69 %
Platelets: 282 10*3/uL (ref 150–400)
RBC: 4.52 MIL/uL (ref 3.87–5.11)
RDW: 14.1 % (ref 11.5–15.5)
WBC: 10.2 10*3/uL (ref 4.0–10.5)
nRBC: 0 % (ref 0.0–0.2)

## 2023-03-04 LAB — URINALYSIS, ROUTINE W REFLEX MICROSCOPIC
Bacteria, UA: NONE SEEN
Bilirubin Urine: NEGATIVE
Glucose, UA: NEGATIVE mg/dL
Hgb urine dipstick: NEGATIVE
Ketones, ur: NEGATIVE mg/dL
Nitrite: NEGATIVE
Protein, ur: 30 mg/dL — AB
Specific Gravity, Urine: 1.024 (ref 1.005–1.030)
pH: 5 (ref 5.0–8.0)

## 2023-03-04 LAB — LIPASE, BLOOD: Lipase: 35 U/L (ref 11–51)

## 2023-03-04 LAB — TROPONIN I (HIGH SENSITIVITY): Troponin I (High Sensitivity): <2 ng/L (ref <18)

## 2023-03-04 LAB — HCG, SERUM, QUALITATIVE: Preg, Serum: NEGATIVE

## 2023-03-04 MED ORDER — HYDROCODONE-ACETAMINOPHEN 5-325 MG PO TABS: 2.0000 | ORAL_TABLET | ORAL | 0 refills | Status: AC | PRN

## 2023-03-04 MED ORDER — SODIUM CHLORIDE 0.9 % IV BOLUS
1000.0000 mL | Freq: Once | INTRAVENOUS | Status: AC
  Administered 2023-03-04: 1000 mL via INTRAVENOUS

## 2023-03-04 NOTE — ED Triage Notes (Signed)
Pt presents with abdominal pain that started in middle of night. Pt endorses nausea.

## 2023-03-04 NOTE — Discharge Instructions (Signed)
Take hydrocodone as prescribed as needed for recurrent pain.  Follow-up with a primary doctor/general surgeon when you relocate.  Return to the ER if you develop worsening pain, high fevers, or for other new and concerning symptoms.

## 2023-03-04 NOTE — ED Provider Notes (Signed)
[place] EMERGENCY DEPARTMENT AT [facility] Provider Note   CSN: [id number] Arrival date & time: 03/04/23  [id number]     History  Chief Complaint  Patient presents with   Abdominal Pain    Heather Khan is a 37 y.o. female.  Patient is a 37 year old female with past medical history of ADHD, anxiety, bipolar, prior appendectomy.  Patient presenting today with complaints of abdominal pain.  She woke up from sleep at 3 AM with severe pain across her upper abdomen/lower chest radiating into her back.  She has felt nauseated, but took Zofran and has not vomited.  She denies any fevers or chills.  She denies any diarrhea or constipation.  She reports a history of kidney stones but this feels different  The history is provided by the patient.       Home Medications Prior to Admission medications   Medication Sig Start Date End Date Taking? Authorizing Provider  triamcinolone cream (KENALOG) 0.1 % Apply 1 application. topically 2 (two) times daily. 03/16/22   Sonny Masters, FNP      Allergies    Buspirone, Erythromycin, Penicillins, Amoxicillin, Erythromycin base, Iohexol, Ivp dye [iodinated contrast media], Penicillins, Zoloft [sertraline], and Zoloft [sertraline hcl]    Review of Systems   Review of Systems  All other systems reviewed and are negative.   Physical Exam Updated Vital Signs Temp 98 F (36.7 C) (Oral)  Physical Exam Vitals and nursing note reviewed.  Constitutional:      General: She is not in acute distress.    Appearance: She is well-developed. She is not diaphoretic.  HENT:     Head: Normocephalic and atraumatic.  Cardiovascular:     Rate and Rhythm: Normal rate and regular rhythm.     Heart sounds: No murmur heard.    No friction rub. No gallop.  Pulmonary:     Effort: Pulmonary effort is normal. No respiratory distress.     Breath sounds: Normal breath sounds. No wheezing.  Abdominal:     General: Bowel sounds are normal. There is no  distension.     Palpations: Abdomen is soft.     Tenderness: There is abdominal tenderness in the epigastric area. There is no right CVA tenderness, left CVA tenderness, guarding or rebound.  Musculoskeletal:        General: Normal range of motion.     Cervical back: Normal range of motion and neck supple.  Skin:    General: Skin is warm and dry.  Neurological:     General: No focal deficit present.     Mental Status: She is alert and oriented to person, place, and time.     ED Results / Procedures / Treatments   Labs (all labs ordered are listed, but only abnormal results are displayed) Labs Reviewed  COMPREHENSIVE METABOLIC PANEL  LIPASE, BLOOD  CBC WITH DIFFERENTIAL/PLATELET  URINALYSIS, ROUTINE W REFLEX MICROSCOPIC  HCG, SERUM, QUALITATIVE  TROPONIN I (HIGH SENSITIVITY)    EKG EKG Interpretation  Date/Time:  Friday Mar 04 2023 05:14:05 EDT Ventricular Rate:  77 PR Interval:  145 QRS Duration: 84 QT Interval:  369 QTC Calculation: 418 R Axis:   70 Text Interpretation: Sinus rhythm Normal ECG Confirmed by Geoffery Lyons ([id number]) on 03/04/2023 6:39:09 AM  Radiology No results found.  Procedures Procedures    Medications Ordered in ED Medications  sodium chloride 0.9 % bolus 1,000 mL (has no administration in time range)    ED Course/ Medical Decision Making/ A&P  Patient is a 37 year old female presenting with the acute onset of epigastric pain radiating through to her back that woke her from sleep at approximately 3 AM.  Pain seems to be subsiding somewhat upon presentation.  Patient arrives here with stable vital signs and is afebrile.  She has tenderness in the epigastric region, but exam is otherwise unremarkable.  Workup initiated including CBC, CMP, lipase, and urinalysis, all of which are essentially unremarkable.  Troponin is negative.  CT scan of the abdomen and pelvis shows cholelithiasis, but no other acute intra-abdominal process.  Patient's pain  seems to have subsided and is now feeling better.  I suspect symptoms are related to her gallbladder as she does have multiple stones.  She does report similar episodes in the past after eating.  At this point I feel as though she can safely be discharged.  I will prescribe pain medication which she can take if she needs this for additional episodes.  Patient and her husband tell me they are moving out of state in the next week and will establish care with a primary doctor/surgeon once they relocate.  Final Clinical Impression(s) / ED Diagnoses Final diagnoses:  None    Rx / DC Orders ED Discharge Orders     None         Geoffery Lyons, MD 03/04/23 [id number]
